# Patient Record
Sex: Female | Born: 1950 | Race: White | Hispanic: No | Smoking: Former smoker
Health system: Southern US, Community
[De-identification: ages and names within clinical notes are randomized; demographics above are authoritative.]

## PROBLEM LIST (undated history)

## (undated) DIAGNOSIS — F329 Major depressive disorder, single episode, unspecified: Secondary | ICD-10-CM

## (undated) DIAGNOSIS — K219 Gastro-esophageal reflux disease without esophagitis: Secondary | ICD-10-CM

## (undated) DIAGNOSIS — J449 Chronic obstructive pulmonary disease, unspecified: Secondary | ICD-10-CM

## (undated) DIAGNOSIS — K589 Irritable bowel syndrome without diarrhea: Secondary | ICD-10-CM

## (undated) DIAGNOSIS — M359 Systemic involvement of connective tissue, unspecified: Secondary | ICD-10-CM

## (undated) DIAGNOSIS — Z8619 Personal history of other infectious and parasitic diseases: Secondary | ICD-10-CM

## (undated) DIAGNOSIS — K5790 Diverticulosis of intestine, part unspecified, without perforation or abscess without bleeding: Secondary | ICD-10-CM

## (undated) DIAGNOSIS — I639 Cerebral infarction, unspecified: Secondary | ICD-10-CM

## (undated) DIAGNOSIS — F419 Anxiety disorder, unspecified: Secondary | ICD-10-CM

## (undated) DIAGNOSIS — N809 Endometriosis, unspecified: Secondary | ICD-10-CM

## (undated) DIAGNOSIS — K449 Diaphragmatic hernia without obstruction or gangrene: Secondary | ICD-10-CM

## (undated) DIAGNOSIS — F32A Depression, unspecified: Secondary | ICD-10-CM

## (undated) HISTORY — PX: ABDOMINAL HYSTERECTOMY: SHX81

## (undated) HISTORY — DX: Depression, unspecified: F32.A

## (undated) HISTORY — PX: KNEE ARTHROSCOPY: SUR90

## (undated) HISTORY — PX: CHOLECYSTECTOMY: SHX55

## (undated) HISTORY — DX: Personal history of other infectious and parasitic diseases: Z86.19

## (undated) HISTORY — DX: Anxiety disorder, unspecified: F41.9

## (undated) HISTORY — PX: LAPAROTOMY: SHX154

## (undated) HISTORY — PX: APPENDECTOMY: SHX54

## (undated) HISTORY — PX: BREAST ENHANCEMENT SURGERY: SHX7

## (undated) HISTORY — DX: Cerebral infarction, unspecified: I63.9

## (undated) HISTORY — DX: Hemochromatosis, unspecified: E83.119

## (undated) HISTORY — PX: BILATERAL OOPHORECTOMY: SHX1221

---
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
## 2012-07-07 ENCOUNTER — Emergency Department (HOSPITAL_COMMUNITY)
Admission: EM | Admit: 2012-07-07 | Discharge: 2012-07-07 | Disposition: A | Discharge status: Home / Self Care | Payer: Medicare Other | Attending: Emergency Medicine | Admitting: Emergency Medicine

## 2012-07-07 ENCOUNTER — Emergency Department (HOSPITAL_COMMUNITY): Payer: Medicare Other

## 2012-07-07 ENCOUNTER — Encounter (HOSPITAL_COMMUNITY): Payer: Self-pay | Admitting: Nurse Practitioner

## 2012-07-07 DIAGNOSIS — R42 Dizziness and giddiness: Secondary | ICD-10-CM

## 2012-07-07 DIAGNOSIS — N390 Urinary tract infection, site not specified: Secondary | ICD-10-CM

## 2012-07-07 HISTORY — DX: Systemic involvement of connective tissue, unspecified: M35.9

## 2012-07-07 HISTORY — DX: Diverticulosis of intestine, part unspecified, without perforation or abscess without bleeding: K57.90

## 2012-07-07 HISTORY — DX: Chronic obstructive pulmonary disease, unspecified: J44.9

## 2012-07-07 HISTORY — DX: Endometriosis, unspecified: N80.9

## 2012-07-07 HISTORY — DX: Irritable bowel syndrome, unspecified: K58.9

## 2012-07-07 HISTORY — DX: Gastro-esophageal reflux disease without esophagitis: K21.9

## 2012-07-07 HISTORY — DX: Diaphragmatic hernia without obstruction or gangrene: K44.9

## 2012-07-07 LAB — CBC WITH DIFFERENTIAL/PLATELET
Basophils Absolute: 0 10*3/uL (ref 0.0–0.1)
Basophils Relative: 1 % (ref 0–1)
HCT: 44.7 % (ref 36.0–46.0)
MCHC: 35.6 g/dL (ref 30.0–36.0)
Monocytes Absolute: 0.4 10*3/uL (ref 0.1–1.0)
Neutro Abs: 3.4 10*3/uL (ref 1.7–7.7)
Platelets: 234 10*3/uL (ref 150–400)
RDW: 12.2 % (ref 11.5–15.5)

## 2012-07-07 LAB — URINALYSIS, ROUTINE W REFLEX MICROSCOPIC
Bilirubin Urine: NEGATIVE
Ketones, ur: NEGATIVE mg/dL
Nitrite: NEGATIVE
Specific Gravity, Urine: 1.012 (ref 1.005–1.030)
Urobilinogen, UA: 0.2 mg/dL (ref 0.0–1.0)

## 2012-07-07 LAB — BASIC METABOLIC PANEL
Calcium: 9.6 mg/dL (ref 8.4–10.5)
Chloride: 106 mEq/L (ref 96–112)
Creatinine, Ser: 0.61 mg/dL (ref 0.50–1.10)
GFR calc Af Amer: >90 mL/min (ref >90)
Potassium: 3.7 mEq/L (ref 3.5–5.1)

## 2012-07-07 LAB — TROPONIN I: Troponin I: <0.3 ng/mL (ref <0.30)

## 2012-07-07 MED ORDER — MECLIZINE HCL 50 MG PO TABS: 50.0000 mg | ORAL_TABLET | Freq: Two times a day (BID) | ORAL | Status: DC | PRN

## 2012-07-07 MED ORDER — CEPHALEXIN 500 MG PO CAPS: 500.0000 mg | ORAL_CAPSULE | Freq: Four times a day (QID) | ORAL | Status: DC

## 2012-07-07 MED ORDER — MECLIZINE HCL 25 MG PO TABS
25.0000 mg | ORAL_TABLET | Freq: Once | ORAL | Status: AC
  Administered 2012-07-07: 25 mg via ORAL
  Filled 2012-07-07: qty 1

## 2012-07-07 MED ORDER — SODIUM CHLORIDE 0.9 % IV BOLUS (SEPSIS)
1000.0000 mL | INTRAVENOUS | Status: AC
  Administered 2012-07-07: 1000 mL via INTRAVENOUS

## 2012-07-07 NOTE — ED Provider Notes (Signed)
History     CSN: [id number]  Arrival date & time 07/07/12  1514   First MD Initiated Contact with Patient 07/07/12 1518      Chief Complaint  Patient presents with  . Dizziness    (Consider location/radiation/quality/duration/timing/severity/associated sxs/prior treatment) HPI Comments: This is a 62 year old female, past medical history remarkable for COPD, who presents emergency department with chief complaint of dizziness. Patient states that she was walking earlier today, when she became lightheaded and felt as though she would faint. She endorses an additional sense of heaviness in bilateral upper extremities. She also endorses some nausea and vomiting, but states that this is normal for her. She denies any syncopal episode or loss of consciousness. She is received 4 mg of Zofran while in route to the ED. She states that she has not tried anything else to alleviate her symptoms. She has not experienced anything like this in the past. Nothing makes her symptoms better or worse. She denies any pain at this time, denies headache, chest pain, shortness of breath, diarrhea, constipation, numbness and tingling of the extremities.  The history is provided by the patient. No language interpreter was used.    No past medical history on file.  No past surgical history on file.  No family history on file.  History  Substance Use Topics  . Smoking status: Not on file  . Smokeless tobacco: Not on file  . Alcohol Use: Not on file    OB History    No data available      Review of Systems  All other systems reviewed and are negative.    Allergies  Review of patient's allergies indicates not on file.  Home Medications  No current outpatient prescriptions on file.  There were no vitals taken for this visit.  Physical Exam  Nursing note and vitals reviewed. Constitutional: She is oriented to person, place, and time. She appears well-developed and well-nourished.  HENT:  Head:  Normocephalic and atraumatic.  Eyes: Conjunctivae normal and EOM are normal. Pupils are equal, round, and reactive to light.  Neck: Normal range of motion. Neck supple.  Cardiovascular: Normal rate and regular rhythm.  Exam reveals no gallop and no friction rub.   No murmur heard. Pulmonary/Chest: Effort normal and breath sounds normal. No respiratory distress. She has no wheezes. She has no rales. She exhibits no tenderness.  Abdominal: Soft. Bowel sounds are normal. She exhibits no distension and no mass. There is no tenderness. There is no rebound and no guarding.  Musculoskeletal: Normal range of motion. She exhibits no edema and no tenderness.  Neurological: She is alert and oriented to person, place, and time.       CN 3-12 intact, strength and sensation intact bilaterally.  Skin: Skin is warm and dry.  Psychiatric: She has a normal mood and affect. Her behavior is normal. Judgment and thought content normal.    ED Course  Procedures (including critical care time)   Labs Reviewed  CBC WITH DIFFERENTIAL  BASIC METABOLIC PANEL  TROPONIN I  URINALYSIS, ROUTINE W REFLEX MICROSCOPIC   No results found.  ED ECG REPORT  I personally interpreted this EKG   Date: 07/07/2012   Rate: 76  Rhythm: normal sinus rhythm  QRS Axis: normal  Intervals: normal  ST/T Wave abnormalities: normal  Conduction Disutrbances:none  Narrative Interpretation:   Old EKG Reviewed: none available  Results for orders placed during the hospital encounter of 07/07/12  CBC WITH DIFFERENTIAL  Component Value Range   WBC 5.7  4.0 - 10.5 K/uL   RBC 4.91  3.87 - 5.11 MIL/uL   Hemoglobin 15.9 (*) 12.0 - 15.0 g/dL   HCT 04.5  40.9 - 81.1 %   MCV 91.0  78.0 - 100.0 fL   MCH 32.4  26.0 - 34.0 pg   MCHC 35.6  30.0 - 36.0 g/dL   RDW 91.4  78.2 - 95.6 %   Platelets 234  150 - 400 K/uL   Neutrophils Relative 59  43 - 77 %   Neutro Abs 3.4  1.7 - 7.7 K/uL   Lymphocytes Relative 32  12 - 46 %   Lymphs Abs  1.8  0.7 - 4.0 K/uL   Monocytes Relative 6  3 - 12 %   Monocytes Absolute 0.4  0.1 - 1.0 K/uL   Eosinophils Relative 2  0 - 5 %   Eosinophils Absolute 0.1  0.0 - 0.7 K/uL   Basophils Relative 1  0 - 1 %   Basophils Absolute 0.0  0.0 - 0.1 K/uL  BASIC METABOLIC PANEL      Component Value Range   Sodium 141  135 - 145 mEq/L   Potassium 3.7  3.5 - 5.1 mEq/L   Chloride 106  96 - 112 mEq/L   CO2 22  19 - 32 mEq/L   Glucose, Bld 99  70 - 99 mg/dL   BUN 12  6 - 23 mg/dL   Creatinine, Ser 2.13  0.50 - 1.10 mg/dL   Calcium 9.6  8.4 - 08.6 mg/dL   GFR calc non Af Amer >90  >90 mL/min   GFR calc Af Amer >90  >90 mL/min  TROPONIN I      Component Value Range   Troponin I <0.30  <0.30 ng/mL  URINALYSIS, ROUTINE W REFLEX MICROSCOPIC      Component Value Range   Color, Urine YELLOW  YELLOW   APPearance CLEAR  CLEAR   Specific Gravity, Urine 1.012  1.005 - 1.030   pH 6.0  5.0 - 8.0   Glucose, UA NEGATIVE  NEGATIVE mg/dL   Hgb urine dipstick NEGATIVE  NEGATIVE   Bilirubin Urine NEGATIVE  NEGATIVE   Ketones, ur NEGATIVE  NEGATIVE mg/dL   Protein, ur NEGATIVE  NEGATIVE mg/dL   Urobilinogen, UA 0.2  0.0 - 1.0 mg/dL   Nitrite NEGATIVE  NEGATIVE   Leukocytes, UA MODERATE (*) NEGATIVE  URINE MICROSCOPIC-ADD ON      Component Value Range   Squamous Epithelial / LPF RARE  RARE   WBC, UA 3-6  <3 WBC/hpf   Bacteria, UA RARE  RARE  TROPONIN I      Component Value Range   Troponin I <0.30  <0.30 ng/mL   Ct Head Wo Contrast  07/07/2012  *RADIOLOGY REPORT*  Clinical Data: Nausea.  Dizziness.  CT HEAD WITHOUT CONTRAST  Technique:  Contiguous axial images were obtained from the base of the skull through the vertex without contrast.  Comparison: None.  Findings: No intracranial hemorrhage.  No CT evidence of large acute infarct.  No hydrocephalus.  No intracranial mass lesion detected on this unenhanced exam.  Visualized sinuses and mastoid air cells clear.  Orbital structures unremarkable.  IMPRESSION:  No evidence of intracranial hemorrhage or CT findings of large acute infarct.   Original Report Authenticated By: Lacy Duverney, M.D.    Dg Chest Port 1 View  07/07/2012  *RADIOLOGY REPORT*  Clinical Data: Dizziness.  PORTABLE CHEST -  1 VIEW  Comparison: None.  Findings: Mild hyperinflation of the lungs.  Heart is normal size. No effusions.  No acute bony abnormality.  No acute airspace opacity.  IMPRESSION: Hyperinflation compatible with COPD.  No acute findings.   Original Report Authenticated By: Charlett Nose, M.D.       1. Dizziness   2. UTI (lower urinary tract infection)       MDM  This is a 62 year old female who presents with dizziness. Patient is relatively low risk for cardiac etiology, she does not have diabetes, hypertension, hyperlipidemia, and denies any chest pain with the dizziness. She does complain of some heaviness in her arms, but does not have any weakness or loss of sensation. However, will obtain basic labs, EKG, and chest x-ray.     6:27 PM The patient's basic labs and imaging studies are unremarkable for any acute process. Patient has been seen by and discussed with Dr. Jeraldine Loots. Patient still not feeling any better. Will order CT scan of the head, and she states that her head feels "funny."  CT head shows no acute process. Patient still states that she feels dizzy. I'm going to try giving her some meclizine and see if she improves.  10:00 PM Patient feeling somewhat improved after having meclizine. Orthostatic vital signs are stable. Patient was able to ambulate. Repeat troponin is negative. Doubt cardiac etiology. I'm going to discharge the patient to home with primary care followup. Will give the patient meclizine to go home with. Patient understands and agrees with the plan. She is stable and ready for discharge.        Roxy Horseman, PA-C 07/07/12 2201  Roxy Horseman, PA-C 07/07/12 2201  Roxy Horseman, PA-C 07/07/12 2206

## 2012-07-07 NOTE — ED Provider Notes (Signed)
Patient presents after an episode of dizziness.  The patient's emergency department stay, she remained hemodynamically stable.  The patient's evaluation was largely reassuring, without evidence of acute changes.  The patient requested CT scan of her head.  This was reassuring.  With an improvement subjectively, and absent acute ongoing complaints, the patient was appropriate for discharge with close outpatient followup.  Absent ongoing chest pain, syncope, nausea, vomiting, or concerning EKG changes, and without positive cardiac markers, the patient was discharged in stable condition  Gerhard Munch, MD 07/07/12 2358

## 2012-07-07 NOTE — ED Notes (Signed)
Patient ambulated in hallway. Became short of breath. O2 sat remained at 99% throughout ambulation.

## 2012-07-07 NOTE — ED Notes (Signed)
Pt has had 4mg  IV  of zofran, pt has 20G left hand

## 2012-07-07 NOTE — ED Notes (Signed)
Per EMS pt sts feeling lightheaded around 2:30 when walking down the fall and felt like she may faint. Pt. Did not have syncopal episode. Pt c/o nausea no pain, headache or dizziness, stroke scale negative- EKG NSR BP 170/92 HR 92 99%/RA CBG 133

## 2012-07-07 NOTE — ED Notes (Signed)
Dr.Lockwood at bedside  

## 2012-07-07 NOTE — ED Notes (Signed)
Unable to ambulate pt. sts feeling dizzy- that head is spinning

## 2014-03-19 HISTORY — PX: COLONOSCOPY: SHX174

## 2014-03-29 HISTORY — PX: ESOPHAGOGASTRODUODENOSCOPY: SHX1529

## 2018-10-24 ENCOUNTER — Telehealth: Payer: Self-pay | Admitting: Cardiology

## 2018-10-24 NOTE — Telephone Encounter (Signed)
Virtual Visit Pre-Appointment Phone Call  "(Name), I am calling you today to discuss your upcoming appointment. We are currently trying to limit exposure to the virus that causes COVID-19 by seeing patients at home rather than in the office."  1. "What is the BEST phone number to call the day of the visit?" - include this in appointment notes  2. Do you have or have access to (through a family member/friend) a smartphone with video capability that we can use for your visit?" a. If yes - list this number in appt notes as cell (if different from BEST phone #) and list the appointment type as a VIDEO visit in appointment notes b. If no - list the appointment type as a PHONE visit in appointment notes  3. Confirm consent - "In the setting of the current Covid19 crisis, you are scheduled for a (phone or video) visit with your provider on (date) at (time).  Just as we do with many in-office visits, in order for you to participate in this visit, we must obtain consent.  If you'd like, I can send this to your mychart (if signed up) or email for you to review.  Otherwise, I can obtain your verbal consent now.  All virtual visits are billed to your insurance company just like a normal visit would be.  By agreeing to a virtual visit, we'd like you to understand that the technology does not allow for your provider to perform an examination, and thus may limit your provider's ability to fully assess your condition. If your provider identifies any concerns that need to be evaluated in person, we will make arrangements to do so.  Finally, though the technology is pretty good, we cannot assure that it will always work on either your or our end, and in the setting of a video visit, we may have to convert it to a phone-only visit.  In either situation, we cannot ensure that we have a secure connection.  Are you willing to proceed?" STAFF: Did the patient verbally acknowledge consent to telehealth visit? Document  YES/NO here: YES  4. Advise patient to be prepared - "Two hours prior to your appointment, go ahead and check your blood pressure, pulse, oxygen saturation, and your weight (if you have the equipment to check those) and write them all down. When your visit starts, your provider will ask you for this information. If you have an Apple Watch or Kardia device, please plan to have heart rate information ready on the day of your appointment. Please have a pen and paper handy nearby the day of the visit as well."  5. Give patient instructions for MyChart download to smartphone OR Doximity/Doxy.me as below if video visit (depending on what platform provider is using)  6. Inform patient they will receive a phone call 15 minutes prior to their appointment time (may be from unknown caller ID) so they should be prepared to answer    [facility] has been deemed a candidate for a follow-up tele-health visit to limit community exposure during the Covid-19 pandemic. I spoke with the patient via phone to ensure availability of phone/video source, confirm preferred email & phone number, and discuss instructions and expectations.  I reminded Wendy Gomez to be prepared with any vital sign and/or heart rhythm information that could potentially be obtained via home monitoring, at the time of her visit. I reminded Wendy Gomez to expect a phone call prior to her visit.  Isaiah Blakes 10/24/2018 1:13 PM   INSTRUCTIONS FOR DOWNLOADING THE MYCHART APP TO SMARTPHONE  - The patient must first make sure to have activated MyChart and know their login information - If Apple, go to [employer] and type in MyChart in the search bar and download the app. If Android, ask patient to go to [employer] and type in [place] in the search bar and download the app. The app is free but as with any other app downloads, their phone may require them to verify saved payment information or Apple/Android  password.  - The patient will need to then log into the app with their MyChart username and password, and select [facility] as their healthcare provider to link the account. When it is time for your visit, go to the MyChart app, find appointments, and click Begin Video Visit. Be sure to Select Allow for your device to access the Microphone and Camera for your visit. You will then be connected, and your provider will be with you shortly.  **If they have any issues connecting, or need assistance please contact MyChart service desk [phone])**  **If using a computer, in order to ensure the best quality for their visit they will need to use either of the following Internet Browsers: [employer], or Google Chrome**  IF USING DOXIMITY or DOXY.ME - The patient will receive a link just prior to their visit by text.     FULL LENGTH CONSENT FOR TELE-HEALTH VISIT   I hereby voluntarily request, consent and authorize [facility] and its employed or contracted physicians, physician assistants, nurse practitioners or other licensed health care professionals (the Practitioner), to provide me with telemedicine health care services (the Services") as deemed necessary by the treating Practitioner. I acknowledge and consent to receive the Services by the Practitioner via telemedicine. I understand that the telemedicine visit will involve communicating with the Practitioner through live audiovisual communication technology and the disclosure of certain medical information by electronic transmission. I acknowledge that I have been given the opportunity to request an in-person assessment or other available alternative prior to the telemedicine visit and am voluntarily participating in the telemedicine visit.  I understand that I have the right to withhold or withdraw my consent to the use of telemedicine in the course of my care at any time, without affecting my right to future care or treatment,  and that the Practitioner or I may terminate the telemedicine visit at any time. I understand that I have the right to inspect all information obtained and/or recorded in the course of the telemedicine visit and may receive copies of available information for a reasonable fee.  I understand that some of the potential risks of receiving the Services via telemedicine include:   Delay or interruption in medical evaluation due to technological equipment failure or disruption;  Information transmitted may not be sufficient (e.g. poor resolution of images) to allow for appropriate medical decision making by the Practitioner; and/or   In rare instances, security protocols could fail, causing a breach of personal health information.  Furthermore, I acknowledge that it is my responsibility to provide information about my medical history, conditions and care that is complete and accurate to the best of my ability. I acknowledge that Practitioner's advice, recommendations, and/or decision may be based on factors not within their control, such as incomplete or inaccurate data provided by me or distortions of diagnostic images or specimens that may result from electronic transmissions. I understand that the  practice of medicine is not an Chief Strategy Officer and that Practitioner makes no warranties or guarantees regarding treatment outcomes. I acknowledge that I will receive a copy of this consent concurrently upon execution via email to the email address I last provided but may also request a printed copy by calling the office of [facility].    I understand that my insurance will be billed for this visit.   I have read or had this consent read to me.  I understand the contents of this consent, which adequately explains the benefits and risks of the Services being provided via telemedicine.   I have been provided ample opportunity to ask questions regarding this consent and the Services and have had my questions  answered to my satisfaction.  I give my informed consent for the services to be provided through the use of telemedicine in my medical care  By participating in this telemedicine visit I agree to the above.

## 2018-10-31 DIAGNOSIS — F411 Generalized anxiety disorder: Secondary | ICD-10-CM | POA: Insufficient documentation

## 2018-10-31 DIAGNOSIS — I10 Essential (primary) hypertension: Secondary | ICD-10-CM | POA: Insufficient documentation

## 2018-10-31 DIAGNOSIS — R0789 Other chest pain: Secondary | ICD-10-CM | POA: Insufficient documentation

## 2018-10-31 DIAGNOSIS — R002 Palpitations: Secondary | ICD-10-CM | POA: Insufficient documentation

## 2018-11-01 ENCOUNTER — Other Ambulatory Visit: Payer: Self-pay

## 2018-11-01 ENCOUNTER — Encounter: Payer: Self-pay | Admitting: Cardiology

## 2018-11-01 ENCOUNTER — Telehealth (INDEPENDENT_AMBULATORY_CARE_PROVIDER_SITE_OTHER): Payer: Medicare HMO | Admitting: Cardiology

## 2018-11-01 VITALS — Ht 67.0 in | Wt 221.0 lb

## 2018-11-01 DIAGNOSIS — R002 Palpitations: Secondary | ICD-10-CM

## 2018-11-01 DIAGNOSIS — R0789 Other chest pain: Secondary | ICD-10-CM

## 2018-11-01 DIAGNOSIS — I10 Essential (primary) hypertension: Secondary | ICD-10-CM

## 2018-11-01 DIAGNOSIS — G4733 Obstructive sleep apnea (adult) (pediatric): Secondary | ICD-10-CM | POA: Insufficient documentation

## 2018-11-01 NOTE — Patient Instructions (Signed)
Medication Instructions:  Your physician recommends that you continue on your current medications as directed. Please refer to the Current Medication list given to you today.  If you need a refill on your cardiac medications before your next appointment, please call your pharmacy.   Lab work: None.  If you have labs (blood work) drawn today and your tests are completely normal, you will receive your results only by: Marland Kitchen MyChart Message (if you have MyChart) OR . A paper copy in the mail If you have any lab test that is abnormal or we need to change your treatment, we will call you to review the results.  Testing/Procedures: Your physician has requested that you have an echocardiogram. Echocardiography is a painless test that uses sound waves to create images of your heart. It provides your doctor with information about the size and shape of your heart and how well your heart's chambers and valves are working. This procedure takes approximately one hour. There are no restrictions for this procedure.  Your physician has recommended that you wear a holter monitor. Holter monitors are medical devices that record the heart's electrical activity. Doctors most often use these monitors to diagnose arrhythmias. Arrhythmias are problems with the speed or rhythm of the heartbeat. The monitor is a small, portable device. You can wear one while you do your normal daily activities. This is usually used to diagnose what is causing palpitations/syncope (passing out). Wear for 14 days.     Follow-Up: At [facility], you and your health needs are our priority.  As part of our continuing mission to provide you with exceptional heart care, we have created designated Provider Care Teams.  These Care Teams include your primary Cardiologist (physician) and Advanced Practice Providers (APPs -  Physician Assistants and Nurse Practitioners) who all work together to provide you with the care you need, when you need it.  You will need a follow up appointment in 1 months.  Please call our office 2 months in advance to schedule this appointment.  You may see No primary care provider on file. or another member of our [employer] Provider Team in [place]: Shirlee More, MD . Jyl Heinz, MD  Any Other Special Instructions Will Be Listed Below (If Applicable).   Echocardiogram An echocardiogram is a procedure that uses painless sound waves (ultrasound) to produce an image of the heart. Images from an echocardiogram can provide important information about:  Signs of coronary artery disease (CAD).  Aneurysm detection. An aneurysm is a weak or damaged part of an artery wall that bulges out from the normal force of blood pumping through the body.  Heart size and shape. Changes in the size or shape of the heart can be associated with certain conditions, including heart failure, aneurysm, and CAD.  Heart muscle function.  Heart valve function.  Signs of a past heart attack.  Fluid buildup around the heart.  Thickening of the heart muscle.  A tumor or infectious growth around the heart valves. Tell a health care provider about:  Any allergies you have.  All medicines you are taking, including vitamins, herbs, eye drops, creams, and over-the-counter medicines.  Any blood disorders you have.  Any surgeries you have had.  Any medical conditions you have.  Whether you are pregnant or may be pregnant. What are the risks? Generally, this is a safe procedure. However, problems may occur, including:  Allergic reaction to dye (contrast) that may be used during the procedure. What happens before the procedure?  No specific preparation is needed. You may eat and drink normally. What happens during the procedure?   An IV tube may be inserted into one of your veins.  You may receive contrast through this tube. A contrast is an injection that improves the quality of the pictures from your heart.  A  gel will be applied to your chest.  A wand-like tool (transducer) will be moved over your chest. The gel will help to transmit the sound waves from the transducer.  The sound waves will harmlessly bounce off of your heart to allow the heart images to be captured in real-time motion. The images will be recorded on a computer. The procedure may vary among health care providers and hospitals. What happens after the procedure?  You may return to your normal, everyday life, including diet, activities, and medicines, unless your health care provider tells you not to do that. Summary  An echocardiogram is a procedure that uses painless sound waves (ultrasound) to produce an image of the heart.  Images from an echocardiogram can provide important information about the size and shape of your heart, heart muscle function, heart valve function, and fluid buildup around your heart.  You do not need to do anything to prepare before this procedure. You may eat and drink normally.  After the echocardiogram is completed, you may return to your normal, everyday life, unless your health care provider tells you not to do that. This information is not intended to replace advice given to you by your health care provider. Make sure you discuss any questions you have with your health care provider. Document Released: 05/28/2000 Document Revised: 07/03/2016 Document Reviewed: 07/03/2016 Elsevier Interactive Patient Education  2019 [employer].

## 2018-11-01 NOTE — Progress Notes (Signed)
Virtual Visit via Telephone Note   This visit type was conducted due to national recommendations for restrictions regarding the COVID-19 Pandemic (e.g. social distancing) in an effort to limit this patient's exposure and mitigate transmission in our community.  Due to her co-morbid illnesses, this patient is at least at moderate risk for complications without adequate follow up.  This format is felt to be most appropriate for this patient at this time.  The patient did not have access to video technology/had technical difficulties with video requiring transitioning to audio format only (telephone).  All issues noted in this document were discussed and addressed.  No physical exam could be performed with this format.  Please refer to the patient's chart for her  consent to telehealth for [facility].  Evaluation Performed:  Follow-up visit  This visit type was conducted due to national recommendations for restrictions regarding the COVID-19 Pandemic (e.g. social distancing).  This format is felt to be most appropriate for this patient at this time.  All issues noted in this document were discussed and addressed.  No physical exam was performed (except for noted visual exam findings with Video Visits).  Please refer to the patient's chart (MyChart message for video visits and phone note for telephone visits) for the patient's consent to telehealth for [facility].  Date:  11/01/2018  ID: Wendy Gomez, DOB 06/24/1950, MRN [id number]   Patient Location: [address] [place] [address]   Provider location:   [place] Office  PCP:  Gala Lewandowsky, MD  Cardiologist:  Jenne Campus, MD     Chief Complaint: I have palpitations  History of Present Illness:    Wendy Gomez is a 68 y.o. female  who presents via audio/video conferencing for a telehealth visit today.  He has history of palpitations for a few years there is typically happen about once a month  however a few weeks ago she got very unusual episode of palpitations she felt her heart speeding up a lot and beating somewhat irregular.  It lasted more than 1 hour which was unusual duration typically just few minutes and her heart goes back to normal she actually went to her family member who is no expectoration when she check her blood pressure which was elevated and heart rate was between 07/14/1947 and she was told she got atrial fibrillation she was told to go to the emergency room but never date eventually about 1-1/2-hour later the patient subsided since that time she has been doing well.  She did not have any chest pain when she got palpitations just awareness of her heart beating fast.  There is mild shortness of breath associated with this.  She actually drove to her family member in the car and then walk to the house and when she walks she felt a little bit dizzy but she did not passed out.  Since the time of the palpitation there was no more palpitations.  Prior episodes were shorter and not as profound. She does have history of sleep apnea however she does not use any CPAP mask. She does have history of hypertension but no diabetes no congestive heart failure no TIA no stroke.  Her chads 2 Vascor equals 3 Does have family history of premature coronary artery disease in some of her family members actually my patients.   The patient does not have symptoms concerning for COVID-19 infection (fever, chills, cough, or new SHORTNESS OF BREATH).    Prior CV studies:  The following studies were reviewed today:  EKG done by primary care physician showed normal sinus rhythm at rate of 72 normal P interval normal QS complex duration morphology no ST-T segment changes.  Thyroid was checked which was normal her LDL was checked as well which was 126 which is elevated.  Troponin I was negative.     Past Medical History:  Diagnosis Date  . Connective tissue disease ([place])   . COPD (chronic  obstructive pulmonary disease) ([place])   . Diverticulosis    hx of   . Endometriosis   . GERD (gastroesophageal reflux disease)   . Irritable bowel syndrome   . Thoracic stomach hernia     Past Surgical History:  Procedure Laterality Date  . ABDOMINAL HYSTERECTOMY    . APPENDECTOMY    . BILATERAL OOPHORECTOMY    . BREAST ENHANCEMENT SURGERY    . CHOLECYSTECTOMY    . CHOLECYSTECTOMY    . LAPAROTOMY       Current Meds  Medication Sig  . albuterol (PROVENTIL HFA;VENTOLIN HFA) 108 (90 BASE) MCG/ACT inhaler Inhale 2 puffs into the lungs every 6 (six) hours as needed.  Marland Kitchen aspirin EC 81 MG tablet Take 81 mg by mouth daily.  Marland Kitchen Chock ELDERBERRY PO Take 1,000 mg by mouth.  . cholecalciferol (VITAMIN D3) 25 MCG (1000 UT) tablet Take 2,000 Units by mouth daily.  . fluticasone (FLONASE) 50 MCG/ACT nasal spray Place 2 sprays into both nostrils as needed.  . metoprolol tartrate (LOPRESSOR) 25 MG tablet Take 12.5 mg by mouth 2 (two) times daily.  . Omega-3 Fatty Acids (FISH OIL) 1000 MG CAPS Take 2 capsules by mouth daily.  Marland Kitchen omeprazole (PRILOSEC) 20 MG capsule Take 20 mg by mouth daily.  . Polyethylene Glycol 3350 (MIRALAX PO) Take by mouth daily.      Family History: The patient's family history includes Diabetes in her sister; Hemochromatosis in her sister; Hyperlipidemia in her sister; Hypertension in her sister.   ROS:   Please see the history of present illness.     All other systems reviewed and are negative.   Labs/Other Tests and Data Reviewed:     Recent Labs: No results found for requested labs within last 8760 hours.  Recent Lipid Panel No results found for: CHOL, TRIG, HDL, CHOLHDL, VLDL, LDLCALC, LDLDIRECT    Exam:    Vital Signs:  Ht 5\' 7"  (1.702 m)   Wt 221 lb (100.2 kg)   BMI 34.61 kg/m     Wt Readings from Last 3 Encounters:  11/01/18 221 lb (100.2 kg)     Well nourished, well developed in no acute distress. Alert awake and at x3.  She does not have  any technical ability to do video visit therefore with talking only over the phone.  Overall asymptomatic stable and comfortable during the conversation.  Quite cheerful.  Diagnosis for this visit:   1. Palpitations   2. Atypical chest pain   3. Essential hypertension   4. Obstructive sleep apnea      ASSESSMENT & PLAN:    1.  Palpitations possibly atrial fibrillation but we do not have hard copy of it before potential initiation of anticoagulation we have approve that she truly have atrial fibrillation.  Therefore, I will ask you to work 2 weeks 0 patch to see if we can catch an episode of atrial fibrillation.  I will also schedule her to have echocardiogram to assess left atrial size.  In the meantime she is taking  aspirin as well as small dose of beta-blocker metoprolol only 12.5 once a day which I will continue. 2.  Atypical chest pain involving the left shoulder not related to exercise.  Not worse with motion coughing and she graded this sensation 3-4 in scale up to 10.  That sensation lasted for few minutes.  Sometimes it does happen with exercise. 3.  Essential hypertension recent initiation of beta-blocker to help with that she reports blood pressure systolic between 583 mmHg and 140 mmHg. 4.  Obstructive sleep apnea she does not want to wear a mask I told her this is clinically essential for somebody with atrial fibrillation but again we need to first establish diagnosis she will monitor if that will be unrevealing she will may require to purchase some device that will allow her to record EKG when she gets some palpitations. 5.  Dyslipidemia not to the point that we need to initiate therapy at the moment.  We talked about healthy lifestyle and exercises on a regular basis and good diet. 6.  COPD she quit smoking 2008.  And she abstain from smoking.  COVID-19 Education: The signs and symptoms of COVID-19 were discussed with the patient and how to seek care for testing (follow up with PCP  or arrange E-visit).  The importance of social distancing was discussed today.  Patient Risk:   After full review of this patients clinical status, I feel that they are at least moderate risk at this time.  Time:   Today, I have spent 31 minutes with the patient with telehealth technology discussing pt health issues.  I spent 60minutes reviewing her chart before the visit.  Visit was finished at 4:33PM.    Medication Adjustments/Labs and Tests Ordered: Current medicines are reviewed at length with the patient today.  Concerns regarding medicines are outlined above.  No orders of the defined types were placed in this encounter.  Medication changes: No orders of the defined types were placed in this encounter.    Disposition:  1 month f/up  Signed, Park Liter, MD, [facility] 11/01/2018 4:35 PM    [place]

## 2018-11-07 ENCOUNTER — Other Ambulatory Visit (INDEPENDENT_AMBULATORY_CARE_PROVIDER_SITE_OTHER): Payer: Medicare HMO

## 2018-11-07 DIAGNOSIS — R0789 Other chest pain: Secondary | ICD-10-CM | POA: Diagnosis not present

## 2018-11-07 DIAGNOSIS — R002 Palpitations: Secondary | ICD-10-CM | POA: Diagnosis not present

## 2018-11-17 ENCOUNTER — Other Ambulatory Visit: Payer: Medicare HMO

## 2018-11-23 ENCOUNTER — Ambulatory Visit (INDEPENDENT_AMBULATORY_CARE_PROVIDER_SITE_OTHER): Payer: Medicare HMO

## 2018-11-23 ENCOUNTER — Other Ambulatory Visit: Payer: Self-pay

## 2018-11-23 DIAGNOSIS — R002 Palpitations: Secondary | ICD-10-CM

## 2018-11-23 DIAGNOSIS — R0789 Other chest pain: Secondary | ICD-10-CM

## 2018-11-23 NOTE — Progress Notes (Signed)
2D Echocardiogram performed     11/23/18 Cardell Peach RDCS, RVT

## 2018-11-30 ENCOUNTER — Other Ambulatory Visit: Payer: Self-pay

## 2018-11-30 ENCOUNTER — Encounter: Payer: Self-pay | Admitting: Cardiology

## 2018-11-30 ENCOUNTER — Telehealth (INDEPENDENT_AMBULATORY_CARE_PROVIDER_SITE_OTHER): Payer: Medicare HMO | Admitting: Cardiology

## 2018-11-30 DIAGNOSIS — R002 Palpitations: Secondary | ICD-10-CM | POA: Diagnosis not present

## 2018-11-30 DIAGNOSIS — I10 Essential (primary) hypertension: Secondary | ICD-10-CM

## 2018-11-30 DIAGNOSIS — R0789 Other chest pain: Secondary | ICD-10-CM

## 2018-11-30 NOTE — Progress Notes (Signed)
Virtual Visit via Telephone Note   This visit type was conducted due to national recommendations for restrictions regarding the COVID-19 Pandemic (e.g. social distancing) in an effort to limit this patient's exposure and mitigate transmission in our community.  Due to her co-morbid illnesses, this patient is at least at moderate risk for complications without adequate follow up.  This format is felt to be most appropriate for this patient at this time.  The patient did not have access to video technology/had technical difficulties with video requiring transitioning to audio format only (telephone).  All issues noted in this document were discussed and addressed.  No physical exam could be performed with this format.  Please refer to the patient's chart for her  consent to telehealth for [facility].  Evaluation Performed:  Follow-up visit  This visit type was conducted due to national recommendations for restrictions regarding the COVID-19 Pandemic (e.g. social distancing).  This format is felt to be most appropriate for this patient at this time.  All issues noted in this document were discussed and addressed.  No physical exam was performed (except for noted visual exam findings with Video Visits).  Please refer to the patient's chart (MyChart message for video visits and phone note for telephone visits) for the patient's consent to telehealth for [facility].  Date:  11/30/2018  ID: Wendy Gomez, DOB [birth year], MRN [id number]   Patient Location: [address] [place] [address]   Provider location:   [place] Office  PCP:  Gala Lewandowsky, MD  Cardiologist:  Jenne Campus, MD     Chief Complaint: Doing better but still has some palpitations  History of Present Illness:    Wendy Gomez is a 68 y.o. female  who presents via audio/video conferencing for a telehealth visit today.  With palpitations, hypertension, COPD.  I spoke to her a few weeks ago  because of palpitations I put her on small dose of beta-blocker monitor has been placed.  I still do not have a monitor back.  Echocardiogram has been done which showed normal left ventricular ejection fraction without any issues.  She is feeling better but still gets some palpitations.  No dizziness no passing out   The patient does not have symptoms concerning for COVID-19 infection (fever, chills, cough, or new SHORTNESS OF BREATH).    Prior CV studies:   The following studies were reviewed today:  Echocardiogram showing normal ejection fraction     Past Medical History:  Diagnosis Date  . Connective tissue disease ([place])   . COPD (chronic obstructive pulmonary disease) ([place])   . Diverticulosis    hx of   . Endometriosis   . GERD (gastroesophageal reflux disease)   . Irritable bowel syndrome   . Thoracic stomach hernia     Past Surgical History:  Procedure Laterality Date  . ABDOMINAL HYSTERECTOMY    . APPENDECTOMY    . BILATERAL OOPHORECTOMY    . BREAST ENHANCEMENT SURGERY    . CHOLECYSTECTOMY    . CHOLECYSTECTOMY    . LAPAROTOMY       Current Meds  Medication Sig  . albuterol (PROVENTIL HFA;VENTOLIN HFA) 108 (90 BASE) MCG/ACT inhaler Inhale 2 puffs into the lungs every 6 (six) hours as needed.  Marland Kitchen aspirin EC 81 MG tablet Take 81 mg by mouth daily.  Marland Kitchen Kamrowski ELDERBERRY PO Take 1,000 mg by mouth.  . cholecalciferol (VITAMIN D3) 25 MCG (1000 UT) tablet Take 2,000 Units by mouth daily.  Marland Kitchen  fluticasone (FLONASE) 50 MCG/ACT nasal spray Place 2 sprays into both nostrils as needed.  . metoprolol tartrate (LOPRESSOR) 25 MG tablet Take 12.5 mg by mouth 2 (two) times daily.  . Omega-3 Fatty Acids (FISH OIL) 1000 MG CAPS Take 2 capsules by mouth daily.  Marland Kitchen omeprazole (PRILOSEC) 20 MG capsule Take 20 mg by mouth daily.  . Polyethylene Glycol 3350 (MIRALAX PO) Take by mouth daily.      Family History: The patient's family history includes Diabetes in her sister; Hemochromatosis  in her sister; Hyperlipidemia in her sister; Hypertension in her sister.   ROS:   Please see the history of present illness.     All other systems reviewed and are negative.   Labs/Other Tests and Data Reviewed:     Recent Labs: No results found for requested labs within last 8760 hours.  Recent Lipid Panel No results found for: CHOL, TRIG, HDL, CHOLHDL, VLDL, LDLCALC, LDLDIRECT    Exam:    Vital Signs:  There were no vitals taken for this visit.    Wt Readings from Last 3 Encounters:  11/01/18 221 lb (100.2 kg)     Well nourished, well developed in no acute distress. Alert oriented x3 talking to me over the phone unable to establish video link.  Doing well  Diagnosis for this visit:   1. Palpitations   2. Atypical chest pain   3. Essential hypertension      ASSESSMENT & PLAN:    1.  Palpitations still present, therefore I will increase dose of metoprolol to 25 twice daily, will wait for monitor to see exactly what kind of arrhythmia with dealing with.  Encouraging is the fact that her echocardiogram showed preserved ejection fraction 2.  Atypical chest pain denies having any 3.  Essential hypertension does not have ability to check her blood pressure.  COVID-19 Education: The signs and symptoms of COVID-19 were discussed with the patient and how to seek care for testing (follow up with PCP or arrange E-visit).  The importance of social distancing was discussed today.  Patient Risk:   After full review of this patients clinical status, I feel that they are at least moderate risk at this time.  Time:   Today, I have spent 15 minutes with the patient with telehealth technology discussing pt health issues.  I spent 5 minutes reviewing her chart before the visit.  Visit was finished at 4:48 PM.    Medication Adjustments/Labs and Tests Ordered: Current medicines are reviewed at length with the patient today.  Concerns regarding medicines are outlined above.  No  orders of the defined types were placed in this encounter.  Medication changes: No orders of the defined types were placed in this encounter.    Disposition: Follow-up in 1 month  Signed, Park Liter, MD, [facility] 11/30/2018 4:49 PM    [place]

## 2018-11-30 NOTE — Patient Instructions (Signed)
Medication Instructions:  .isntcur  If you need a refill on your cardiac medications before your next appointment, please call your pharmacy.   Lab work: None.  If you have labs (blood work) drawn today and your tests are completely normal, you will receive your results only by: Marland Kitchen MyChart Message (if you have MyChart) OR . A paper copy in the mail If you have any lab test that is abnormal or we need to change your treatment, we will call you to review the results.  Testing/Procedures: None.   Follow-Up: At [facility], you and your health needs are our priority.  As part of our continuing mission to provide you with exceptional heart care, we have created designated Provider Care Teams.  These Care Teams include your primary Cardiologist (physician) and Advanced Practice Providers (APPs -  Physician Assistants and Nurse Practitioners) who all work together to provide you with the care you need, when you need it. You will need a follow up appointment in 1 months.  Please call our office 2 months in advance to schedule this appointment.  You may see No primary care provider on file. or another member of our [employer] Provider Team in [place]: Shirlee More, MD . Jyl Heinz, MD  Any Other Special Instructions Will Be Listed Below (If Applicable).

## 2018-12-08 ENCOUNTER — Encounter: Payer: Self-pay | Admitting: Gastroenterology

## 2018-12-11 ENCOUNTER — Telehealth: Payer: Self-pay | Admitting: Emergency Medicine

## 2018-12-11 NOTE — Telephone Encounter (Signed)
Left message for patient to return call for monitor results.

## 2018-12-20 ENCOUNTER — Encounter: Payer: Self-pay | Admitting: Gastroenterology

## 2018-12-20 ENCOUNTER — Telehealth (INDEPENDENT_AMBULATORY_CARE_PROVIDER_SITE_OTHER): Payer: Medicare HMO | Admitting: Gastroenterology

## 2018-12-20 ENCOUNTER — Other Ambulatory Visit: Payer: Self-pay

## 2018-12-20 VITALS — Ht 67.0 in | Wt 222.0 lb

## 2018-12-20 DIAGNOSIS — K5909 Other constipation: Secondary | ICD-10-CM | POA: Diagnosis not present

## 2018-12-20 DIAGNOSIS — R1031 Right lower quadrant pain: Secondary | ICD-10-CM

## 2018-12-20 DIAGNOSIS — Z800 Family history of malignant neoplasm of digestive organs: Secondary | ICD-10-CM | POA: Diagnosis not present

## 2018-12-20 DIAGNOSIS — K219 Gastro-esophageal reflux disease without esophagitis: Secondary | ICD-10-CM | POA: Diagnosis not present

## 2018-12-20 MED ORDER — DICYCLOMINE HCL 10 MG PO CAPS: ORAL_CAPSULE | ORAL | 3 refills | Status: DC

## 2018-12-20 NOTE — Patient Instructions (Addendum)
If you are age 67 or older, your body mass index should be between 23-30. Your Body mass index is 34.77 kg/m. If this is out of the aforementioned range listed, please consider follow up with your Primary Care Provider.  If you are age 31 or younger, your body mass index should be between 19-25. Your Body mass index is 34.77 kg/m. If this is out of the aformentioned range listed, please consider follow up with your Primary Care Provider.   You have [facility] [place] [address] [address]   You are scheduled on 12/27/18 at [place] should arrive 15 minutes prior to your appointment time for registration. Please follow the written instructions below on the day of your exam:  WARNING: IF YOU ARE ALLERGIC TO IODINE/X-RAY DYE, PLEASE NOTIFY RADIOLOGY IMMEDIATELY AT [phone]! YOU WILL BE GIVEN A 13 HOUR PREMEDICATION PREP.  1) Do not eat or drink anything after 7am (4 hours prior to your test) 2) You have been given 2 bottles of oral contrast to drink. The solution may taste better if refrigerated, but do NOT add ice or any other liquid to this solution. Shake well before drinking.    Drink 1 bottle of contrast @ 9am (2 hours prior to your exam)  Drink 1 bottle of contrast @ 10am (1 hour prior to your exam)  You may take any medications as prescribed with a small amount of water, if necessary. If you take any of the following medications: METFORMIN, GLUCOPHAGE, GLUCOVANCE, AVANDAMET, RIOMET, FORTAMET, [place] MET, JANUMET, GLUMETZA or METAGLIP, you MAY be asked to HOLD this medication 48 hours AFTER the exam.  The purpose of you drinking the oral contrast is to aid in the visualization of your intestinal tract. The contrast solution may cause some diarrhea. Depending on your individual set of symptoms, you may also receive an intravenous injection of x-ray contrast/dye. Plan on being at  [facility] for 30 minutes or longer, depending on the type of exam you are having performed.  This test typically takes 30-45 minutes to complete.  If you have any questions regarding your exam or if you need to reschedule, you may call the CT department at [phone] between the hours of 8:00 am and 5:00 pm, Monday-Friday.  ________________________________________________________________________  We have sent the following medications to your pharmacy for you to pick up at your convenience: [place]  Please come to our office at [address] to pick up your contrast before the day of your test.  Follow up 12 weeks.   Thank you,  Dr. Jackquline Denmark

## 2018-12-20 NOTE — Progress Notes (Signed)
Chief Complaint:   Referring Provider:  Gala Lewandowsky, MD      ASSESSMENT AND PLAN;   #1. RLQ Pain. Nl CBC, CMP 12/2018 #2. Chronic constipation. #3. GERD with small HH. #4. FH of colon cancer (dad at age 67), FH of colonic polyps (mom at age 28). 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  Plan: - CT abdo/pel with p.o. and IV contrast (labs in care everywhere and detailed below, Nl Cr) - If neg, colonoscopy further evaluation.  She is due for colonoscopy this year anyway. - Continue miralax 17g po qd. - Bentyl 10mg  po BID (1/2 hr qAC and qHS).  Start over the weekend.  - FU in 12 weeks.  HPI:    Wendy Gomez is a 68 y.o. female  RLQ pain x over last several years, getting worse over the last 3 mnts, worst at night with assoc back pain.  Describes this as sharp, intermittent, colicky, occasionally relieved by defecation.  Has some associated abdominal bloating. H/O constipation, better with miralax qd.  Has been compliant with MiraLAX.  Would have bowel movement at the frequency of 1/day.  The abdominal pain was worse when she was more constipated.  Does have back pain-chronic in nature.  Attributed to DJD.   No nausea, vomiting, heartburn, regurgitation, odynophagia or dysphagia.  No diarrhea. There is no melena or hematochezia. No unintentional weight loss.  Convinced that her pain is due to pelvic adhesions.  No nonsteroidals.  Used to work in [employer] retired.  Additional past surgical history: -H/O endometriosis, total hysterectomy -Lap chole (Dr Noberto Retort) complicated by postop leak requiring ERCP (Dr Lyndel Safe)  Past GI procedures: -EGD 03/29/2014: Small hiatal hernia, mild gastritis. CLO- neg -Colonoscopy 03/29/2014 (adult, some difficulty): Mild sigmoid diverticulosis, small internal hemorrhoids Past Medical History:  Diagnosis Date  . Anxiety   . Connective tissue disease ([place])   . COPD (chronic obstructive pulmonary disease) ([place])   . Depression   .  Diverticulosis    hx of   . Endometriosis   . GERD (gastroesophageal reflux disease)   . History of Helicobacter pylori infection   . Irritable bowel syndrome   . Thoracic stomach hernia     Past Surgical History:  Procedure Laterality Date  . ABDOMINAL HYSTERECTOMY    . APPENDECTOMY    . BILATERAL OOPHORECTOMY    . BREAST ENHANCEMENT SURGERY    . CHOLECYSTECTOMY    . CHOLECYSTECTOMY    . COLONOSCOPY  03/19/2014   Mild sigmoid diverticulosis. External and internal hemorrhoids. Otherwise normal colonoscopy to TI.   Marland Kitchen ESOPHAGOGASTRODUODENOSCOPY  03/29/2014   Small hiatal hernia. Minimal gastritis.   Marland Kitchen LAPAROTOMY      Family History  Problem Relation Age of Onset  . Hypertension Sister   . Hyperlipidemia Sister   . Diabetes Sister   . Hemochromatosis Sister   . Colon cancer Father   . Colon cancer Paternal Uncle     Social History   Tobacco Use  . Smoking status: Former Smoker    Quit date: 07/08/2007    Years since quitting: 11.4  . Smokeless tobacco: Never Used  Substance Use Topics  . Alcohol use: No  . Drug use: No    Current Outpatient Medications  Medication Sig Dispense Refill  . albuterol (PROVENTIL HFA;VENTOLIN HFA) 108 (90 BASE) MCG/ACT inhaler Inhale 2 puffs into the lungs every 6 (six) hours as needed.    Marland Kitchen aspirin EC 81 MG tablet Take 81 mg by mouth daily.    Marland Kitchen  [place] ELDERBERRY PO Take 1,000 mg by mouth.    . cholecalciferol (VITAMIN D3) 25 MCG (1000 UT) tablet Take 2,000 Units by mouth daily.    . fluticasone (FLONASE) 50 MCG/ACT nasal spray Place 2 sprays into both nostrils as needed.    . metoprolol tartrate (LOPRESSOR) 25 MG tablet Take 12.5 mg by mouth 2 (two) times daily.    . Omega-3 Fatty Acids (FISH OIL) 1000 MG CAPS Take 2 capsules by mouth daily.    Marland Kitchen omeprazole (PRILOSEC) 20 MG capsule Take 20 mg by mouth daily.    . Polyethylene Glycol 3350 (MIRALAX PO) Take by mouth daily.     No current facility-administered medications for this visit.      Allergies  Allergen Reactions  . Morphine And Related Nausea And Vomiting    Review of Systems:  Constitutional: Denies fever, chills, diaphoresis, appetite change and has fatigue.  HEENT: Denies photophobia, eye pain, redness, hearing loss, ear pain, congestion, sore throat, rhinorrhea, sneezing, mouth sores, neck pain, neck stiffness and tinnitus.   Respiratory: Denies SOB, DOE, cough, chest tightness,  and wheezing.   Cardiovascular: Denies chest pain, palpitations and leg swelling.  Genitourinary: Denies dysuria, urgency, frequency, hematuria, flank pain and difficulty urinating.  Musculoskeletal: Has myalgias, back pain, joint swelling, arthralgias and gait problem.  Skin: No rash.  Neurological: Denies dizziness, seizures, syncope, weakness, light-headedness, numbness and headaches.  Hematological: Denies adenopathy. Easy bruising, personal or family bleeding history  Psychiatric/Behavioral: Has anxiety or depression     Physical Exam:    Ht 5\' 7"  (1.702 m)   Wt 222 lb (100.7 kg)   BMI 34.77 kg/m  Filed Weights   12/20/18 0832  Weight: 222 lb (100.7 kg)   televisit  Data Reviewed: I have personally reviewed following labs and imaging studies  Labs-12/13/2018 normal CBC with hemoglobin 15.2, normal CMP with creatinine 0.69.  Normal liver function tests except for total bilirubin 1.4.  Normal hemoglobin A1c.  This service was provided via video ([facility]) telemedicine.  The patient was located at home.  The provider was located in office.  The patient did consent to this telephone visit and is aware of possible charges through their insurance for this visit.  The patient was referred by Dr. Spero Curb.   Time spent on call/review of medical records/review of care everywhere/coordination of care: 45 min   Carmell Austria, MD 12/20/2018, 9:43 AM  Cc: Gala Lewandowsky, MD

## 2018-12-27 ENCOUNTER — Ambulatory Visit (HOSPITAL_BASED_OUTPATIENT_CLINIC_OR_DEPARTMENT_OTHER)
Admission: RE | Admit: 2018-12-27 | Discharge: 2018-12-27 | Disposition: A | Discharge status: Home / Self Care | Payer: Medicare HMO | Source: Ambulatory Visit | Attending: Gastroenterology | Admitting: Gastroenterology

## 2018-12-27 ENCOUNTER — Other Ambulatory Visit: Payer: Self-pay

## 2018-12-27 ENCOUNTER — Encounter (HOSPITAL_BASED_OUTPATIENT_CLINIC_OR_DEPARTMENT_OTHER): Payer: Self-pay

## 2018-12-27 DIAGNOSIS — K5909 Other constipation: Secondary | ICD-10-CM | POA: Diagnosis present

## 2018-12-27 DIAGNOSIS — R1031 Right lower quadrant pain: Secondary | ICD-10-CM | POA: Diagnosis present

## 2018-12-27 MED ORDER — IOHEXOL 300 MG/ML  SOLN
100.0000 mL | Freq: Once | INTRAMUSCULAR | Status: AC | PRN
  Administered 2018-12-27: 11:00:00 100 mL via INTRAVENOUS

## 2018-12-28 ENCOUNTER — Telehealth: Payer: Self-pay | Admitting: Gastroenterology

## 2018-12-28 DIAGNOSIS — K219 Gastro-esophageal reflux disease without esophagitis: Secondary | ICD-10-CM

## 2018-12-28 DIAGNOSIS — Z800 Family history of malignant neoplasm of digestive organs: Secondary | ICD-10-CM

## 2018-12-28 DIAGNOSIS — K5909 Other constipation: Secondary | ICD-10-CM

## 2018-12-28 DIAGNOSIS — R1031 Right lower quadrant pain: Secondary | ICD-10-CM

## 2018-12-28 NOTE — Telephone Encounter (Signed)
Yes she is scheduled on 01/01/2019 and I cancelled the pre visit appt Will you please let her know? Thank you

## 2018-12-28 NOTE — Telephone Encounter (Signed)
Called and spoke with patient-patient informed of colonoscopy procedure being scheduled; patient is agreeable with plan of care and understands to notify the office if she is unable to obtain her colonoscopy prep instructions; Patient verbalized understanding of information/instructions;  Patient was advised to call the office at [phone] if questions/concerns arise;

## 2018-12-29 ENCOUNTER — Telehealth: Payer: Self-pay | Admitting: Gastroenterology

## 2018-12-29 NOTE — Telephone Encounter (Signed)

## 2018-12-29 NOTE — Telephone Encounter (Signed)
Called and spoke with patient-walked patient through signing into [facility] - patient was able to see prep instructions and read the first few lines of text in the document to assure RN she had the correct information; Patient verbalized understanding of information/instructions; patient was advised to call back should questions/concerns arise;

## 2018-12-29 NOTE — Telephone Encounter (Signed)
Pt called stating that she has not received instructions for prep yet, she said that you were going to email it to her.

## 2019-01-01 ENCOUNTER — Other Ambulatory Visit: Payer: Self-pay

## 2019-01-01 ENCOUNTER — Encounter: Payer: Self-pay | Admitting: Gastroenterology

## 2019-01-01 ENCOUNTER — Ambulatory Visit (AMBULATORY_SURGERY_CENTER): Payer: Medicare HMO | Admitting: Gastroenterology

## 2019-01-01 VITALS — BP 127/77 | HR 53 | Temp 98.9°F | Resp 17 | Ht 67.0 in | Wt 222.0 lb

## 2019-01-01 DIAGNOSIS — K648 Other hemorrhoids: Secondary | ICD-10-CM | POA: Diagnosis not present

## 2019-01-01 DIAGNOSIS — K635 Polyp of colon: Secondary | ICD-10-CM

## 2019-01-01 DIAGNOSIS — D125 Benign neoplasm of sigmoid colon: Secondary | ICD-10-CM

## 2019-01-01 DIAGNOSIS — K5730 Diverticulosis of large intestine without perforation or abscess without bleeding: Secondary | ICD-10-CM | POA: Diagnosis not present

## 2019-01-01 DIAGNOSIS — R1031 Right lower quadrant pain: Secondary | ICD-10-CM

## 2019-01-01 DIAGNOSIS — D124 Benign neoplasm of descending colon: Secondary | ICD-10-CM

## 2019-01-01 MED ORDER — SODIUM CHLORIDE 0.9 % IV SOLN: 500.0000 mL | Freq: Once | INTRAVENOUS | Status: DC

## 2019-01-01 NOTE — Op Note (Signed)
[facility] Patient Name: Wendy Gomez Procedure Date: 01/01/2019 7:21 AM MRN: [id number] Endoscopist: Jackquline Denmark , MD Age: 68 Referring MD:  Date of Birth: 1950-07-13 Gender: Female Account #: [id number] Procedure:                Colonoscopy Indications:              #1. RLQ Pain. Nl CBC, CMP 12/2018 with neg CT.                           #2. Chronic constipation.                           #3. FH of colon cancer (dad at age 19), FH of                            colonic polyps (mom at age 31). Medicines:                Monitored Anesthesia Care Procedure:                Pre-Anesthesia Assessment:                           - Prior to the procedure, a History and Physical                            was performed, and patient medications and                            allergies were reviewed. The patient's tolerance of                            previous anesthesia was also reviewed. The risks                            and benefits of the procedure and the sedation                            options and risks were discussed with the patient.                            All questions were answered, and informed consent                            was obtained. Prior Anticoagulants: The patient has                            taken no previous anticoagulant or antiplatelet                            agents. ASA Grade Assessment: II - A patient with                            mild systemic disease. After reviewing the risks  and benefits, the patient was deemed in                            satisfactory condition to undergo the procedure.                           After obtaining informed consent, the colonoscope                            was passed under direct vision. Throughout the                            procedure, the patient's blood pressure, pulse, and                            oxygen saturations were monitored continuously. The                Colonoscope was introduced through the anus and                            advanced to the 2 cm into the ileum. The                            colonoscopy was performed without difficulty. The                            patient tolerated the procedure well. The quality                            of the bowel preparation was good. The terminal                            ileum, ileocecal valve, appendiceal orifice, and                            rectum were photographed. Scope In: 7:42:04 AM Scope Out: 8:06:29 AM Scope Withdrawal Time: 0 hours 16 minutes 56 seconds  Total Procedure Duration: 0 hours 24 minutes 25 seconds  Findings:                 Five sessile polyps were found in the proximal                            sigmoid colon and mid descending colon. The polyps                            were 4 to 10 mm in size. These polyps were removed                            with a cold snare. Resection and retrieval were                            complete. Estimated blood loss: none.  Few small-mouthed diverticula were found in the                            sigmoid colon.                           Non-bleeding internal hemorrhoids were found during                            retroflexion. The hemorrhoids were small.                           The exam was otherwise without abnormality on                            direct and retroflexion views.                           The terminal ileum appeared normal. Complications:            No immediate complications. Estimated Blood Loss:     Estimated blood loss: none. Impression:               -Colonic polyps status post polypectomy.                           -Mild sigmoid diverticulosis.                           -Otherwise normal colonoscopy to TI. Recommendation:           - Patient has a contact number available for                            emergencies. The signs and symptoms of potential                             delayed complications were discussed with the                            patient. Return to normal activities tomorrow.                            Written discharge instructions were provided to the                            patient.                           - Resume previous diet.                           - Continue MiraLAX 17 g p.o. once a day. Continue                            rest of the medications.                           -  Await pathology results.                           - Repeat colonoscopy for surveillance based on                            pathology results.                           - Return to GI clinic in 12 weeks. Jackquline Denmark, MD 01/01/2019 8:13:58 AM This report has been signed electronically.

## 2019-01-01 NOTE — Patient Instructions (Addendum)
YOU HAD AN ENDOSCOPIC PROCEDURE TODAY AT [facility]:   Refer to the procedure report that was given to you for any specific questions about what was found during the examination.  If the procedure report does not answer your questions, please call your gastroenterologist to clarify.  If you requested that your care partner not be given the details of your procedure findings, then the procedure report has been included in a sealed envelope for you to review at your convenience later.  YOU SHOULD EXPECT: Some feelings of bloating in the abdomen. Passage of more gas than usual.  Walking can help get rid of the air that was put into your GI tract during the procedure and reduce the bloating. If you had a lower endoscopy (such as a colonoscopy or flexible sigmoidoscopy) you may notice spotting of blood in your stool or on the toilet paper. If you underwent a bowel prep for your procedure, you may not have a normal bowel movement for a few days.  Please Note:  You might notice some irritation and congestion in your nose or some drainage.  This is from the oxygen used during your procedure.  There is no need for concern and it should clear up in a day or so.  SYMPTOMS TO REPORT IMMEDIATELY:   Following lower endoscopy (colonoscopy or flexible sigmoidoscopy):  Excessive amounts of blood in the stool  Significant tenderness or worsening of abdominal pains  Swelling of the abdomen that is new, acute  Fever of 100F or higher    For urgent or emergent issues, a gastroenterologist can be reached at any hour by calling [phone].   DIET:  We do recommend a small meal at first, but then you may proceed to your regular diet.  Drink plenty of fluids but you should avoid alcoholic beverages for 24 hours.  ACTIVITY:  You should plan to take it easy for the rest of today and you should NOT DRIVE or use heavy machinery until tomorrow (because of the sedation medicines used during the test).     FOLLOW UP: Our staff will call the number listed on your records 48-72 hours following your procedure to check on you and address any questions or concerns that you may have regarding the information given to you following your procedure. If we do not reach you, we will leave a message.  We will attempt to reach you two times.  During this call, we will ask if you have developed any symptoms of COVID 19. If you develop any symptoms (ie: fever, flu-like symptoms, shortness of breath, cough etc.) before then, please call [phone].  If you test positive for Covid 19 in the 2 weeks post procedure, please call and report this information to [place].    If any biopsies were taken you will be contacted by phone or by letter within the next 1-3 weeks.  Please call us at [phone] if you have not heard about the biopsies in 3 weeks.    SIGNATURES/CONFIDENTIALITY: You and/or your care partner have signed paperwork which will be entered into your electronic medical record.  These signatures attest to the fact that that the information above on your After Visit Summary has been reviewed and is understood.  Full responsibility of the confidentiality of this discharge information lies with you and/or your care-partner.    Handouts were given to you on polyps, diverticulosis, and hemorrhoids. Continue MIRALAX 17 gm by mouth once a day. You may resume your  current medications today. Await biopsy results. Call Dr. Lyndel Safe and let him know how you are in 1 week. Return for office appointment in 12 week.  The office will call you to set up this appointment.  Please call if any questions or concerns.

## 2019-01-01 NOTE — Progress Notes (Addendum)
Pt c/o abdominal cramping.  Abdomen is soft.  Encouraged pt to bear down and pass flatus.  Pt did so. Maw  Per Dr. Lyndel Safe given pt the written results from her CT scan of abdomin and pelvis 05-29-19.  Done. Maw  No complaints noted on discharge. maw

## 2019-01-01 NOTE — Progress Notes (Deleted)
Pt's states no medical or surgical changes since previsit or office visit. 

## 2019-01-01 NOTE — Progress Notes (Signed)
PT taken to PACU. Monitors in place. VSS. Report given to RN. 

## 2019-01-01 NOTE — Progress Notes (Signed)
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

## 2019-01-01 NOTE — Progress Notes (Signed)
Called to room to assist during endoscopic procedure.  Patient ID and intended procedure confirmed with present staff. Received instructions for my participation in the procedure from the performing physician.  

## 2019-01-01 NOTE — Progress Notes (Signed)
Fayrene Fearing took temp and Rica Mote took vitals.

## 2019-01-03 ENCOUNTER — Ambulatory Visit (INDEPENDENT_AMBULATORY_CARE_PROVIDER_SITE_OTHER): Payer: Medicare HMO | Admitting: Cardiology

## 2019-01-03 ENCOUNTER — Other Ambulatory Visit: Payer: Self-pay

## 2019-01-03 ENCOUNTER — Telehealth: Payer: Self-pay | Admitting: *Deleted

## 2019-01-03 ENCOUNTER — Encounter: Payer: Self-pay | Admitting: Cardiology

## 2019-01-03 VITALS — BP 116/74 | HR 53 | Ht 67.0 in | Wt 226.0 lb

## 2019-01-03 DIAGNOSIS — R0789 Other chest pain: Secondary | ICD-10-CM

## 2019-01-03 DIAGNOSIS — R002 Palpitations: Secondary | ICD-10-CM | POA: Diagnosis not present

## 2019-01-03 DIAGNOSIS — I10 Essential (primary) hypertension: Secondary | ICD-10-CM

## 2019-01-03 NOTE — Telephone Encounter (Signed)
  Follow up Call-  Call back number 01/01/2019  Post procedure Call Back phone  # [phone]  Permission to leave phone message Yes  Some recent data might be hidden     Patient questions:  Do you have a fever, pain , or abdominal swelling? Yes  Pain Score  3 * RLQ pain which she had prior to procedure  Have you tolerated food without any problems? Yes.    Have you been able to return to your normal activities? Yes.    Do you have any questions about your discharge instructions: Diet   No. Medications  No. Follow up visit  No.  Do you have questions or concerns about your Care? Yes.   Pt had questions re diverticulosis vs diverticulitis, referred her to her handout that was given in recovery and reviewed this with her. She was also asking about her EGD, reminded her she needs to make a f/u appt in 12 weeks. Actions: * If pain score is 4 or above: No action needed, pain <4.   1. Have you developed a fever since your procedure? no  2.   Have you had an respiratory symptoms (SOB or cough) since your procedure? no  3.   Have you tested positive for COVID 19 since your procedure no  4.   Have you had any family members/close contacts diagnosed with the COVID 19 since your procedure?  no   If yes to any of these questions please route to Joylene John, RN and Alphonsa Gin, Therapist, sports.

## 2019-01-03 NOTE — Progress Notes (Signed)
Cardiology Office Note:    Date:  01/03/2019   ID:  Wendy Gomez, DOB 02-16-1951, MRN [id number]  PCP:  Gala Lewandowsky, MD  Cardiologist:  Jenne Campus, MD    Referring MD: Gala Lewandowsky, MD   Chief Complaint  Patient presents with  . 1 month follow up  Doing better my palpitations are much better  History of Present Illness:    Wendy Gomez is a 68 y.o. female with palpitations.  She did wear monitor which showed runs of narrow complex tachycardia.  One episode of wide-complex tachycardia somewhat irregular.  I put her on beta-blocker she is feeling much better but still described to have some palpitations.  She also described to have some movement her dizzy spells.  This is not worse since I put her on metoprolol but did not get any better.  Echocardiogram was done which showed normal left ventricular ejection fraction.  There is no syncope  Past Medical History:  Diagnosis Date  . Anxiety   . Connective tissue disease ([place])   . COPD (chronic obstructive pulmonary disease) ([place])   . Depression   . Diverticulosis    hx of   . Endometriosis   . GERD (gastroesophageal reflux disease)   . History of Helicobacter pylori infection   . Irritable bowel syndrome   . Thoracic stomach hernia     Past Surgical History:  Procedure Laterality Date  . ABDOMINAL HYSTERECTOMY    . APPENDECTOMY    . BILATERAL OOPHORECTOMY    . BREAST ENHANCEMENT SURGERY    . CHOLECYSTECTOMY    . CHOLECYSTECTOMY    . COLONOSCOPY  03/19/2014   Mild sigmoid diverticulosis. External and internal hemorrhoids. Otherwise normal colonoscopy to TI.   Marland Kitchen ESOPHAGOGASTRODUODENOSCOPY  03/29/2014   Small hiatal hernia. Minimal gastritis.   Marland Kitchen LAPAROTOMY      Current Medications: Current Meds  Medication Sig  . albuterol (PROVENTIL HFA;VENTOLIN HFA) 108 (90 BASE) MCG/ACT inhaler Inhale 2 puffs into the lungs every 6 (six) hours as needed.  Marland Kitchen aspirin EC 81 MG tablet Take 81 mg by mouth daily.  Marland Kitchen [place]  ELDERBERRY PO Take 1,000 mg by mouth.  . cholecalciferol (VITAMIN D3) 25 MCG (1000 UT) tablet Take 2,000 Units by mouth daily.  Marland Kitchen dicyclomine (BENTYL) 10 MG capsule Take one twice daily 1/2 hour before meals and at bedtime.  . fluticasone (FLONASE) 50 MCG/ACT nasal spray Place 2 sprays into both nostrils as needed.  . metoprolol tartrate (LOPRESSOR) 25 MG tablet Take 25 mg by mouth 2 (two) times daily.   . Omega-3 Fatty Acids (FISH OIL) 1000 MG CAPS Take 2 capsules by mouth daily.  Marland Kitchen omeprazole (PRILOSEC) 20 MG capsule Take 20 mg by mouth daily.  . Polyethylene Glycol 3350 (MIRALAX PO) Take by mouth daily.     Allergies:   Morphine and related   Social History   Socioeconomic History  . Marital status: Divorced    Spouse name: Not on file  . Number of children: Not on file  . Years of education: Not on file  . Highest education level: Not on file  Occupational History  . Not on file  Social Needs  . Financial resource strain: Not on file  . Food insecurity    Worry: Not on file    Inability: Not on file  . Transportation needs    Medical: Not on file    Non-medical: Not on file  Tobacco Use  . Smoking status: Former Smoker  Quit date: 07/08/2007    Years since quitting: 11.4  . Smokeless tobacco: Never Used  Substance and Sexual Activity  . Alcohol use: No  . Drug use: No  . Sexual activity: Not on file  Lifestyle  . Physical activity    Days per week: Not on file    Minutes per session: Not on file  . Stress: Not on file  Relationships  . Social Herbalist on phone: Not on file    Gets together: Not on file    Attends religious service: Not on file    Active member of club or organization: Not on file    Attends meetings of clubs or organizations: Not on file    Relationship status: Not on file  Other Topics Concern  . Not on file  Social History Narrative  . Not on file     Family History: The patient's family history includes Colon cancer in  her father and paternal uncle; Diabetes in her sister; Hemochromatosis in her sister; Hyperlipidemia in her sister; Hypertension in her sister. There is no history of Rectal cancer, Stomach cancer, or Esophageal cancer. ROS:   Please see the history of present illness.    All 14 point review of systems negative except as described per history of present illness  EKGs/Labs/Other Studies Reviewed:      Recent Labs: No results found for requested labs within last 8760 hours.  Recent Lipid Panel No results found for: CHOL, TRIG, HDL, CHOLHDL, VLDL, LDLCALC, LDLDIRECT  Physical Exam:    VS:  BP 116/74   Pulse (!) 53   Ht 5\' 7"  (1.702 m)   Wt 226 lb (102.5 kg)   SpO2 97%   BMI 35.40 kg/m     Wt Readings from Last 3 Encounters:  01/03/19 226 lb (102.5 kg)  01/01/19 222 lb (100.7 kg)  12/20/18 222 lb (100.7 kg)     GEN:  Well nourished, well developed in no acute distress HEENT: Normal NECK: No JVD; No carotid bruits LYMPHATICS: No lymphadenopathy CARDIAC: RRR, no murmurs, no rubs, no gallops RESPIRATORY:  Clear to auscultation without rales, wheezing or rhonchi  ABDOMEN: Soft, non-tender, non-distended MUSCULOSKELETAL:  No edema; No deformity  SKIN: Warm and dry LOWER EXTREMITIES: no swelling NEUROLOGIC:  Alert and oriented x 3 PSYCHIATRIC:  Normal affect   ASSESSMENT:    1. Palpitations   2. Essential hypertension   3. Atypical chest pain    PLAN:    In order of problems listed above:  1. Palpitations.  I will ask her to wear monitor again and this is mostly to check make sure her sinus node recovery time is fine after putting her on beta-blocker.  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  Therefore we need to make sure we get this right.  Based on that I will decide if the way to go is to increase dose of beta-blocker or maybe adding calcium channel blocker.  Ablation need to be considered as well.  However, I do not  think we have that point yet. 2. Essential hypertension blood pressure well controlled 3. Atypical chest pain doing well from that point review denies having any   Medication Adjustments/Labs and Tests Ordered: Current medicines are reviewed at length with the patient today.  Concerns regarding medicines are outlined above.  No orders of the defined types were placed in this encounter.  Medication changes: No  orders of the defined types were placed in this encounter.   Signed, Park Liter, MD, [facility] 01/03/2019 10:16 AM    [place]

## 2019-01-03 NOTE — Patient Instructions (Signed)
Medication Instructions:  Your physician recommends that you continue on your current medications as directed. Please refer to the Current Medication list given to you today.  If you need a refill on your cardiac medications before your next appointment, please call your pharmacy.   Lab work: None.  If you have labs (blood work) drawn today and your tests are completely normal, you will receive your results only by: Marland Kitchen MyChart Message (if you have MyChart) OR . A paper copy in the mail If you have any lab test that is abnormal or we need to change your treatment, we will call you to review the results.  Testing/Procedures: Your physician has recommended that you wear a holter monitor. Holter monitors are medical devices that record the heart's electrical activity. Doctors most often use these monitors to diagnose arrhythmias. Arrhythmias are problems with the speed or rhythm of the heartbeat. The monitor is a small, portable device. You can wear one while you do your normal daily activities. This is usually used to diagnose what is causing palpitations/syncope (passing out). Wear for 3 days.     Follow-Up: At [facility], you and your health needs are our priority.  As part of our continuing mission to provide you with exceptional heart care, we have created designated Provider Care Teams.  These Care Teams include your primary Cardiologist (physician) and Advanced Practice Providers (APPs -  Physician Assistants and Nurse Practitioners) who all work together to provide you with the care you need, when you need it. You will need a follow up appointment in 3 months.

## 2019-01-03 NOTE — Addendum Note (Signed)
Addended by: Ashok Norris on: 01/03/2019 10:24 AM   Modules accepted: Orders

## 2019-01-03 NOTE — Addendum Note (Signed)
Addended by: Linna Hoff R on: 01/03/2019 10:30 AM   Modules accepted: Orders

## 2019-01-04 ENCOUNTER — Encounter: Payer: Self-pay | Admitting: Gastroenterology

## 2019-01-09 ENCOUNTER — Other Ambulatory Visit (INDEPENDENT_AMBULATORY_CARE_PROVIDER_SITE_OTHER): Payer: Medicare HMO

## 2019-01-09 DIAGNOSIS — R002 Palpitations: Secondary | ICD-10-CM | POA: Diagnosis not present

## 2019-02-05 ENCOUNTER — Telehealth: Payer: Self-pay | Admitting: Cardiology

## 2019-02-05 ENCOUNTER — Other Ambulatory Visit: Payer: Self-pay | Admitting: Gastroenterology

## 2019-02-05 MED ORDER — METOPROLOL TARTRATE 25 MG PO TABS: 12.5000 mg | ORAL_TABLET | Freq: Two times a day (BID) | ORAL | 1 refills | Status: AC

## 2019-02-05 MED ORDER — DILTIAZEM HCL ER COATED BEADS 120 MG PO CP24: 120.0000 mg | ORAL_CAPSULE | Freq: Every day | ORAL | 1 refills | Status: DC

## 2019-02-05 NOTE — Telephone Encounter (Signed)
Please call patient with results of monitor 

## 2019-02-05 NOTE — Telephone Encounter (Signed)
Patient informed of monitor results and to decrease metoprolol succinate to 12.5 mg twice daily and start Cardizem 120 mg daily. She verbally understands no further questions.

## 2019-02-05 NOTE — Addendum Note (Signed)
Addended by: Ashok Norris on: 02/05/2019 05:13 PM   Modules accepted: Orders

## 2019-04-04 ENCOUNTER — Ambulatory Visit: Payer: Medicare HMO | Admitting: Cardiology

## 2019-04-11 DIAGNOSIS — Z20822 Contact with and (suspected) exposure to covid-19: Secondary | ICD-10-CM | POA: Insufficient documentation

## 2019-05-04 ENCOUNTER — Ambulatory Visit: Payer: Medicare HMO | Admitting: Cardiology

## 2019-05-30 ENCOUNTER — Ambulatory Visit (INDEPENDENT_AMBULATORY_CARE_PROVIDER_SITE_OTHER): Payer: Medicare HMO | Admitting: Cardiology

## 2019-05-30 ENCOUNTER — Other Ambulatory Visit: Payer: Self-pay

## 2019-05-30 ENCOUNTER — Encounter: Payer: Self-pay | Admitting: Cardiology

## 2019-05-30 VITALS — BP 130/66 | HR 64 | Ht 67.0 in | Wt 241.2 lb

## 2019-05-30 DIAGNOSIS — Z8679 Personal history of other diseases of the circulatory system: Secondary | ICD-10-CM

## 2019-05-30 DIAGNOSIS — I10 Essential (primary) hypertension: Secondary | ICD-10-CM

## 2019-05-30 DIAGNOSIS — R0789 Other chest pain: Secondary | ICD-10-CM | POA: Diagnosis not present

## 2019-05-30 DIAGNOSIS — R002 Palpitations: Secondary | ICD-10-CM

## 2019-05-30 NOTE — Patient Instructions (Signed)
Medication Instructions:  Your physician recommends that you continue on your current medications as directed. Please refer to the Current Medication list given to you today.  *If you need a refill on your cardiac medications before your next appointment, please call your pharmacy*  Lab Work: None.  If you have labs (blood work) drawn today and your tests are completely normal, you will receive your results only by: . MyChart Message (if you have MyChart) OR . A paper copy in the mail If you have any lab test that is abnormal or we need to change your treatment, we will call you to review the results.  Testing/Procedures: Your physician has requested that you have a carotid duplex. This test is an ultrasound of the carotid arteries in your neck. It looks at blood flow through these arteries that supply the brain with blood. Allow one hour for this exam. There are no restrictions or special instructions.    Follow-Up: At [facility], you and your health needs are our priority.  As part of our continuing mission to provide you with exceptional heart care, we have created designated Provider Care Teams.  These Care Teams include your primary Cardiologist (physician) and Advanced Practice Providers (APPs -  Physician Assistants and Nurse Practitioners) who all work together to provide you with the care you need, when you need it.  Your next appointment:   6 month(s)  The format for your next appointment:   In Person  Provider:   Robert Krasowski, MD  Other Instructions  

## 2019-05-30 NOTE — Progress Notes (Signed)
Cardiology Office Note:    Date:  05/30/2019   ID:  Lizzie Gitchell [place], [place] 03-13-1951, MRN [id number]  PCP:  Gala Lewandowsky, MD  Cardiologist:  Jenne Campus, MD    Referring MD: Gala Lewandowsky, MD   Chief Complaint  Patient presents with  . Follow-up  Doing fine  History of Present Illness:    Wendy Gomez is a 68 y.o. female with past medical history significant for palpitations, supraventricular tachycardia, also some episode of dizziness.  Quite extensive evaluation has been done which included 2 monitors.  She seems to be doing well still described to have some rare episode of palpitations but that does not bother her too much and she is happy the way she feels she is already on beta-blocker as well as calcium channel blocker will continue.  The purpose of last monitor was to make sure her sinus node recovery time is reasonable.  And it is.  Therefore, we will continue present management.  Today she told me that 3 years ago she had carotid ultrasound done which show 50% blockages on both sides.  We will need to repeat the test.  Past Medical History:  Diagnosis Date  . Anxiety   . Connective tissue disease ([place])   . COPD (chronic obstructive pulmonary disease) ([place])   . Depression   . Diverticulosis    hx of   . Endometriosis   . GERD (gastroesophageal reflux disease)   . History of Helicobacter pylori infection   . Irritable bowel syndrome   . Thoracic stomach hernia     Past Surgical History:  Procedure Laterality Date  . ABDOMINAL HYSTERECTOMY    . APPENDECTOMY    . BILATERAL OOPHORECTOMY    . BREAST ENHANCEMENT SURGERY    . CHOLECYSTECTOMY    . CHOLECYSTECTOMY    . COLONOSCOPY  03/19/2014   Mild sigmoid diverticulosis. External and internal hemorrhoids. Otherwise normal colonoscopy to TI.   Marland Kitchen ESOPHAGOGASTRODUODENOSCOPY  03/29/2014   Small hiatal hernia. Minimal gastritis.   Marland Kitchen LAPAROTOMY      Current Medications: Current Meds   Medication Sig  . albuterol (PROVENTIL HFA;VENTOLIN HFA) 108 (90 BASE) MCG/ACT inhaler Inhale 2 puffs into the lungs every 6 (six) hours as needed.  Marland Kitchen aspirin EC 81 MG tablet Take 81 mg by mouth daily.  . cholecalciferol (VITAMIN D3) 25 MCG (1000 UT) tablet Take 2,000 Units by mouth daily.  Marland Kitchen dicyclomine (BENTYL) 10 MG capsule TAKE 1 CAPSULE BY MOUTH TWICE DAILY 1/2 HOUR BEFORE MEALS AND AT BEDTIME.  Marland Kitchen diltiazem (CARDIZEM CD) 120 MG 24 hr capsule Take 1 capsule (120 mg total) by mouth daily.  . fluticasone (FLONASE) 50 MCG/ACT nasal spray Place 2 sprays into both nostrils as needed.  . metoprolol tartrate (LOPRESSOR) 25 MG tablet Take 0.5 tablets (12.5 mg total) by mouth 2 (two) times daily.  . Omega-3 Fatty Acids (FISH OIL) 1000 MG CAPS Take 2 capsules by mouth daily.  Marland Kitchen omeprazole (PRILOSEC) 20 MG capsule Take 20 mg by mouth daily.  . Polyethylene Glycol 3350 (MIRALAX PO) Take by mouth daily.     Allergies:   Morphine and related   Social History   Socioeconomic History  . Marital status: Divorced    Spouse name: Not on file  . Number of children: Not on file  . Years of education: Not on file  . Highest education level: Not on file  Occupational History  . Not on file  Tobacco Use  . Smoking  status: Former Smoker    Quit date: 07/08/2007    Years since quitting: 11.9  . Smokeless tobacco: Never Used  Substance and Sexual Activity  . Alcohol use: No  . Drug use: No  . Sexual activity: Not on file  Other Topics Concern  . Not on file  Social History Narrative  . Not on file   Social Determinants of Health   Financial Resource Strain:   . Difficulty of Paying Living Expenses: Not on file  Food Insecurity:   . Worried About Charity fundraiser in the Last Year: Not on file  . Ran Out of Food in the Last Year: Not on file  Transportation Needs:   . Lack of Transportation (Medical): Not on file  . Lack of Transportation (Non-Medical): Not on file  Physical Activity:   .  Days of Exercise per Week: Not on file  . Minutes of Exercise per Session: Not on file  Stress:   . Feeling of Stress : Not on file  Social Connections:   . Frequency of Communication with Friends and Family: Not on file  . Frequency of Social Gatherings with Friends and Family: Not on file  . Attends Religious Services: Not on file  . Active Member of Clubs or Organizations: Not on file  . Attends Archivist Meetings: Not on file  . Marital Status: Not on file     Family History: The patient's family history includes Colon cancer in her father and paternal uncle; Diabetes in her sister; Hemochromatosis in her sister; Hyperlipidemia in her sister; Hypertension in her sister. There is no history of Rectal cancer, Stomach cancer, or Esophageal cancer. ROS:   Please see the history of present illness.    All 14 point review of systems negative except as described per history of present illness  EKGs/Labs/Other Studies Reviewed:      Recent Labs: No results found for requested labs within last 8760 hours.  Recent Lipid Panel No results found for: CHOL, TRIG, HDL, CHOLHDL, VLDL, LDLCALC, LDLDIRECT  Physical Exam:    VS:  BP 130/66   Pulse 64   Ht 5\' 7"  (1.702 m)   Wt 241 lb 3.2 oz (109.4 kg)   SpO2 98%   BMI 37.78 kg/m     Wt Readings from Last 3 Encounters:  05/30/19 241 lb 3.2 oz (109.4 kg)  01/03/19 226 lb (102.5 kg)  01/01/19 222 lb (100.7 kg)     GEN:  Well nourished, well developed in no acute distress HEENT: Normal NECK: No JVD; No carotid bruits LYMPHATICS: No lymphadenopathy CARDIAC: RRR, no murmurs, no rubs, no gallops RESPIRATORY:  Clear to auscultation without rales, wheezing or rhonchi  ABDOMEN: Soft, non-tender, non-distended MUSCULOSKELETAL:  No edema; No deformity  SKIN: Warm and dry LOWER EXTREMITIES: no swelling NEUROLOGIC:  Alert and oriented x 3 PSYCHIATRIC:  Normal affect   ASSESSMENT:    1. Palpitations   2. Atypical chest pain    3. Essential hypertension    PLAN:    In order of problems listed above:  1. Palpitations doing well from that point review happy the way she feels.  I talked to her that if this palpitation become more bothersome we can increase dose of Cardizem from 1 20-1 80.  She will let us know if this is what happening.  In the meantime we will continue present management. 2. Atypical chest pain.  Denies having any. 3. Essential hypertension blood pressure well controlled continue present  management. 4. History of carotic arterial disease.  We will schedule her to have carotic ultrasound.  Apparently 3 years ago cardiac ultrasound being done which showed 50% blockages both sides.   Medication Adjustments/Labs and Tests Ordered: Current medicines are reviewed at length with the patient today.  Concerns regarding medicines are outlined above.  No orders of the defined types were placed in this encounter.  Medication changes: No orders of the defined types were placed in this encounter.   Signed, Park Liter, MD, [facility] 05/30/2019 9:18 AM    [place]

## 2019-05-30 NOTE — Addendum Note (Signed)
Addended by: Ashok Norris on: 05/30/2019 09:26 AM   Modules accepted: Orders

## 2019-07-26 ENCOUNTER — Other Ambulatory Visit: Payer: Self-pay | Admitting: Cardiology

## 2019-10-31 ENCOUNTER — Institutional Professional Consult (permissible substitution): Payer: Medicare HMO | Admitting: Pulmonary Disease

## 2019-11-20 ENCOUNTER — Other Ambulatory Visit: Payer: Self-pay | Admitting: Cardiology

## 2019-12-12 ENCOUNTER — Ambulatory Visit (INDEPENDENT_AMBULATORY_CARE_PROVIDER_SITE_OTHER): Payer: Medicare HMO

## 2019-12-12 ENCOUNTER — Ambulatory Visit: Payer: Medicare HMO | Admitting: Pulmonary Disease

## 2019-12-12 ENCOUNTER — Other Ambulatory Visit: Payer: Self-pay

## 2019-12-12 ENCOUNTER — Encounter: Payer: Self-pay | Admitting: Pulmonary Disease

## 2019-12-12 VITALS — BP 134/72 | HR 64 | Temp 98.0°F | Ht 67.0 in | Wt 254.6 lb

## 2019-12-12 DIAGNOSIS — G4733 Obstructive sleep apnea (adult) (pediatric): Secondary | ICD-10-CM

## 2019-12-12 DIAGNOSIS — R0600 Dyspnea, unspecified: Secondary | ICD-10-CM

## 2019-12-12 DIAGNOSIS — R0609 Other forms of dyspnea: Secondary | ICD-10-CM | POA: Insufficient documentation

## 2019-12-12 NOTE — Assessment & Plan Note (Addendum)
Cardiac testing was normal including echo a year ago, no evidence of pulmonary hypertension or valvular disease .  She does have mild pedal edema that is unexplained  We will rule out pulmonary disease by doing chest x-ray and PFTs. She does not seem to have ILD on exam  Meanwhile continue using albuterol.  If she does have significant obstructive lung disease then will suggest LABA/LAMA combination

## 2019-12-12 NOTE — Assessment & Plan Note (Signed)
She seems to have been diagnosed with OSA many years ago.  Prior sleep studies not available.  Also bed partner history is not available since she lives by herself Given excessive daytime somnolence, narrow pharyngeal exam, witnessed apneas & loud snoring, obstructive sleep apnea is very likely & an overnight polysomnogram will be scheduled as a home study. The pathophysiology of obstructive sleep apnea , it's cardiovascular consequences & modes of treatment including CPAP were discused with the patient in detail & they evidenced understanding.  Alternate diagnosis would be circadian rhythm disorder since she has been waking up at 3 AM for the last 2 and half years due to her work. She would be willing to use a CPAP machine if needed

## 2019-12-12 NOTE — Patient Instructions (Signed)
Schedule home sleep test.  Chest x-ray today. Schedule PFTs

## 2019-12-12 NOTE — Progress Notes (Signed)
Subjective:    Patient ID: Wendy Gomez, female    DOB: Oct 26, 1950, 69 y.o.   MRN: [id number]  HPI  69 year old remote smoker presents for evaluation of dyspnea on exertion and sleep disordered breathing. She was diagnosed with COPD many years ago in [place] and only uses albuterol MDI.  She was given an Advair inhaler sample but this was too expensive and she does not remember if this helped or not.  She smoked about 1 pack/day for about 25 years before she quit in 2009.  She has a history of recurrent pneumonia and bronchitis many years ago over the last couple of years have been good. She reports dyspnea on exertion sometimes on walking 2 blocks or 100 yards.  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  ESS 3 Bedtime can be anywhere between 7 PM to 10 PM, she sleeps on her left side with a Biopty pillow, reports 3-4 nocturnal awakenings and is out of bed anywhere from 3 to 5 AM feeling tired without dryness of mouth or headaches. She has gained 50 pounds in the last 2 years.  PMH -cardiac evaluation for palpitations including echo that was normal, on Cardizem and metoprolol -Hereditary hemochromatosis requiring 2 weekly phlebotomy, her sister also has this condition, last hemoglobin 08/2019 was 14.1  Significant tests/ events reviewed  CT abdomen 12/2018 clear lungs Echocardiogram 11/2018 normal LVEF, normal valves   I have reviewed PCP notes and prior records in epic  Past Medical History:  Diagnosis Date  . Anxiety   . Connective tissue disease ([place])   . COPD (chronic obstructive pulmonary disease) ([place])   . Depression   . Diverticulosis    hx of   . Endometriosis   . GERD (gastroesophageal reflux disease)   . History of Helicobacter pylori infection   . Irritable bowel syndrome   . Thoracic stomach hernia    Past Surgical History:  Procedure Laterality Date  . ABDOMINAL HYSTERECTOMY      . APPENDECTOMY    . BILATERAL OOPHORECTOMY    . BREAST ENHANCEMENT SURGERY    . CHOLECYSTECTOMY    . CHOLECYSTECTOMY    . COLONOSCOPY  03/19/2014   Mild sigmoid diverticulosis. External and internal hemorrhoids. Otherwise normal colonoscopy to TI.   Marland Kitchen ESOPHAGOGASTRODUODENOSCOPY  03/29/2014   Small hiatal hernia. Minimal gastritis.   Marland Kitchen LAPAROTOMY      Allergies  Allergen Reactions  . Morphine And Related Nausea And Vomiting    Social History   Socioeconomic History  . Marital status: Divorced    Spouse name: Not on file  . Number of children: Not on file  . Years of education: Not on file  . Highest education level: Not on file  Occupational History  . Not on file  Tobacco Use  . Smoking status: Former Smoker    Quit date: 07/08/2007    Years since quitting: 12.4  . Smokeless tobacco: Never Used  Vaping Use  . Vaping Use: Never used  Substance and Sexual Activity  . Alcohol use: No  . Drug use: No  . Sexual activity: Not on file  Other Topics Concern  . Not on file  Social History Narrative  . Not on file   Social Determinants of Health   Financial Resource Strain:   . Difficulty of Paying Living Expenses:   Food Insecurity:   . Worried About Charity fundraiser in  the Last Year:   . [place] in the Last Year:   Transportation Needs:   . Film/video editor (Medical):   Marland Kitchen Lack of Transportation (Non-Medical):   Physical Activity:   . Days of Exercise per Week:   . Minutes of Exercise per Session:   Stress:   . Feeling of Stress :   Social Connections:   . Frequency of Communication with Friends and Family:   . Frequency of Social Gatherings with Friends and Family:   . Attends Religious Services:   . Active Member of Clubs or Organizations:   . Attends Archivist Meetings:   Marland Kitchen Marital Status:   Intimate Partner Violence:   . Fear of Current or Ex-Partner:   . Emotionally Abused:   Marland Kitchen Physically Abused:   . Sexually Abused:       Family History  Problem Relation Age of Onset  . Hypertension Sister   . Hyperlipidemia Sister   . Diabetes Sister   . Hemochromatosis Sister   . Colon cancer Father   . Colon cancer Paternal Uncle   . Rectal cancer Neg Hx   . Stomach cancer Neg Hx   . Esophageal cancer Neg Hx      Review of Systems Constitutional: negative for anorexia, fevers and sweats  Eyes: negative for irritation, redness and visual disturbance  Ears, nose, mouth, throat, and face: negative for earaches, epistaxis, nasal congestion and sore throat  Respiratory: negative for cough, , sputum and wheezing  Cardiovascular: negative for chest pain,  orthopnea, palpitations and syncope  Gastrointestinal: negative for abdominal pain, constipation, diarrhea, melena, nausea and vomiting  Genitourinary:negative for dysuria, frequency and hematuria  Hematologic/lymphatic: negative for bleeding, easy bruising and lymphadenopathy  Musculoskeletal:negative for arthralgias, muscle weakness and stiff joints  Neurological: negative for coordination problems, gait problems, headaches and weakness  Endocrine: negative for diabetic symptoms including polydipsia, polyuria and weight loss     Objective:   Physical Exam  Gen. Pleasant, obese, in no distress, normal affect ENT - no pallor,icterus, no post nasal drip, class 2-3 airway Neck: No JVD, no thyromegaly, no carotid bruits Lungs: no use of accessory muscles, no dullness to percussion, decreased without rales or rhonchi  Cardiovascular: Rhythm regular, heart sounds  normal, no murmurs or gallops, 1+ peripheral edema Abdomen: soft and non-tender, no hepatosplenomegaly, BS normal. Musculoskeletal: No deformities, no cyanosis or clubbing Neuro:  alert, non focal, no tremors       Assessment & Plan:

## 2020-01-04 ENCOUNTER — Telehealth: Payer: Self-pay | Admitting: Pulmonary Disease

## 2020-01-07 NOTE — Telephone Encounter (Signed)
I have called pt and scheduled her HST.  Nothing further needed.

## 2020-01-09 LAB — LAB REPORT - SCANNED
Free T4: 1.2
TSH: 2.4 (ref 0.41–5.90)

## 2020-01-15 ENCOUNTER — Ambulatory Visit: Payer: Medicare HMO

## 2020-01-15 ENCOUNTER — Other Ambulatory Visit: Payer: Self-pay

## 2020-01-15 DIAGNOSIS — R0609 Other forms of dyspnea: Secondary | ICD-10-CM

## 2020-01-15 DIAGNOSIS — G4733 Obstructive sleep apnea (adult) (pediatric): Secondary | ICD-10-CM

## 2020-01-18 ENCOUNTER — Telehealth: Payer: Self-pay | Admitting: Pulmonary Disease

## 2020-01-18 DIAGNOSIS — G4733 Obstructive sleep apnea (adult) (pediatric): Secondary | ICD-10-CM | POA: Diagnosis not present

## 2020-01-18 NOTE — Telephone Encounter (Signed)
HST showed mild OSA , AHI 11/hour She may benefit from CPAP  If she is willing to proceed, proceed with auto CPAP 5 to 15 cm mask of choice  Office visit with me/APP in 6 weeks

## 2020-01-24 NOTE — Telephone Encounter (Signed)
Wants to wait to her appt on 03/13/2020 to think about it and discuss with family if she wants to start wearing cpap will make decision at f/u

## 2020-03-10 ENCOUNTER — Inpatient Hospital Stay (HOSPITAL_COMMUNITY): Admission: RE | Admit: 2020-03-10 | Payer: Medicare HMO | Source: Ambulatory Visit

## 2020-03-13 ENCOUNTER — Ambulatory Visit: Payer: Medicare HMO | Admitting: Adult Health

## 2020-03-13 ENCOUNTER — Encounter: Payer: Self-pay | Admitting: Pharmacist

## 2020-03-21 ENCOUNTER — Inpatient Hospital Stay: Payer: Medicare HMO

## 2020-04-04 ENCOUNTER — Inpatient Hospital Stay: Payer: Medicare HMO

## 2020-04-11 ENCOUNTER — Inpatient Hospital Stay: Payer: Medicare HMO | Attending: Oncology

## 2020-04-11 ENCOUNTER — Other Ambulatory Visit: Payer: Self-pay

## 2020-04-11 NOTE — Progress Notes (Signed)
Wendy Gomez presents today for phlebotomy per MD orders. Phlebotomy procedure started at 1407 and ended at 1417 501 grams removed. Patient observed for 30 minutes after procedure without any incident. Patient tolerated procedure well. IV needle removed intact.

## 2020-04-11 NOTE — Patient Instructions (Signed)

## 2020-05-22 ENCOUNTER — Other Ambulatory Visit: Payer: Self-pay | Admitting: Cardiology

## 2020-05-22 NOTE — Telephone Encounter (Signed)
Rx refill sent to pharmacy. 

## 2020-06-02 NOTE — Progress Notes (Signed)
PT STABLE AT TIME OF DISCHARGE 

## 2020-06-04 ENCOUNTER — Ambulatory Visit: Payer: Medicare HMO | Admitting: Adult Health

## 2020-06-09 ENCOUNTER — Inpatient Hospital Stay: Payer: Medicare HMO

## 2020-06-09 ENCOUNTER — Telehealth: Payer: Self-pay | Admitting: Oncology

## 2020-06-09 NOTE — Telephone Encounter (Signed)
Patient called to reschedule to 12/30 due to being sick.

## 2020-06-12 ENCOUNTER — Inpatient Hospital Stay: Payer: Medicare HMO | Attending: Oncology

## 2020-06-12 ENCOUNTER — Other Ambulatory Visit: Payer: Self-pay

## 2020-06-12 NOTE — Patient Instructions (Signed)

## 2020-06-12 NOTE — Progress Notes (Signed)
Wendy Gomez presents today for phlebotomy per MD orders. Phlebotomy procedure started at 1525 and ended at 1535 500 grams removed. Patient observed for 30 minutes after procedure without any incident. Patient tolerated procedure well. IV needle removed intact.

## 2020-07-05 ENCOUNTER — Other Ambulatory Visit: Payer: Self-pay | Admitting: Cardiology

## 2020-07-25 ENCOUNTER — Other Ambulatory Visit: Payer: Self-pay | Admitting: Hematology and Oncology

## 2020-07-25 ENCOUNTER — Inpatient Hospital Stay: Payer: Medicare HMO | Attending: Oncology

## 2020-07-25 ENCOUNTER — Other Ambulatory Visit: Payer: Self-pay

## 2020-07-25 LAB — IRON,TIBC AND FERRITIN PANEL
%SAT: 19.2
Ferritin: 13.7
Iron: 53
TIBC: 275

## 2020-07-25 LAB — BASIC METABOLIC PANEL
BUN: 10 (ref 4–21)
CO2: 26 — AB (ref 13–22)
Chloride: 108 (ref 99–108)
Creatinine: 0.8 (ref 0.5–1.1)
Glucose: 91
Potassium: 4 (ref 3.4–5.3)
Sodium: 140 (ref 137–147)

## 2020-07-25 LAB — CBC AND DIFFERENTIAL
HCT: 41 (ref 36–46)
Hemoglobin: 13.7 (ref 12.0–16.0)
Neutrophils Absolute: 2.86
Platelets: 252 (ref 150–399)
WBC: 5.4

## 2020-07-25 LAB — COMPREHENSIVE METABOLIC PANEL
Albumin: 4.1 (ref 3.5–5.0)
Calcium: 9 (ref 8.7–10.7)

## 2020-07-25 LAB — HEPATIC FUNCTION PANEL
ALT: 23 (ref 7–35)
AST: 24 (ref 13–35)
Alkaline Phosphatase: 96 (ref 25–125)
Bilirubin, Total: 0.6

## 2020-07-25 LAB — CBC: RBC: 4.6 (ref 3.87–5.11)

## 2020-07-28 ENCOUNTER — Inpatient Hospital Stay: Payer: Medicare HMO | Admitting: Oncology

## 2020-07-28 ENCOUNTER — Other Ambulatory Visit: Payer: Self-pay | Admitting: Oncology

## 2020-07-28 ENCOUNTER — Inpatient Hospital Stay: Payer: Medicare HMO

## 2020-07-28 ENCOUNTER — Telehealth: Payer: Self-pay | Admitting: Oncology

## 2020-07-28 NOTE — Telephone Encounter (Signed)
Per 2/14 los next appt sched and given to patient 

## 2020-08-03 NOTE — Progress Notes (Signed)
[facility]  [address] [place],  [place]  [address] [phone]  Clinic Day:  07/28/2020  Referring physician: Gala Lewandowsky, MD   HISTORY OF PRESENT ILLNESS:  The patient is a 70 y.o. 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  She comes in today to reassess her iron parameters.  Since her last visit, the patient has been doing okay.  She denies having any new symptoms which concern her for complications related to her underlying hemochromatosis.   PHYSICAL EXAM:  Blood pressure (!) 141/80, pulse 69, temperature 98.2 F (36.8 C), resp. rate 16, height 5\' 7"  (1.702 m), weight 259 lb 9.6 oz (117.8 kg), SpO2 96 %. Wt Readings from Last 3 Encounters:  07/28/20 259 lb 9.6 oz (117.8 kg)  06/12/20 257 lb 4 oz (116.7 kg)  08/15/19 250 lb 6 oz (113.6 kg)   Body mass index is 40.66 kg/m. Performance status (ECOG): 0 - Asymptomatic Physical Exam Constitutional:      Appearance: Normal appearance. She is not ill-appearing.  HENT:     Mouth/Throat:     Mouth: Mucous membranes are moist.     Pharynx: Oropharynx is clear. No oropharyngeal exudate or posterior oropharyngeal erythema.  Cardiovascular:     Rate and Rhythm: Normal rate and regular rhythm.     Heart sounds: No murmur heard. No friction rub. No gallop.   Pulmonary:     Effort: Pulmonary effort is normal. No respiratory distress.     Breath sounds: Normal breath sounds. No wheezing, rhonchi or rales.  Chest:  Breasts:     Right: No axillary adenopathy or supraclavicular adenopathy.     Left: No axillary adenopathy or supraclavicular adenopathy.    Abdominal:     General: Bowel sounds are normal. There is no distension.     Palpations: Abdomen is soft. There is no mass.     Tenderness: There is no abdominal tenderness.  Musculoskeletal:        General: No swelling.     Right lower leg: No edema.     Left lower leg: No edema.  Lymphadenopathy:     Cervical: No cervical  adenopathy.     Upper Body:     Right upper body: No supraclavicular or axillary adenopathy.     Left upper body: No supraclavicular or axillary adenopathy.     Lower Body: No right inguinal adenopathy. No left inguinal adenopathy.  Skin:    General: Skin is warm.     Coloration: Skin is not jaundiced.     Findings: No lesion or rash.  Neurological:     General: No focal deficit present.     Mental Status: She is alert and oriented to person, place, and time. Mental status is at baseline.     Cranial Nerves: Cranial nerves are intact.  Psychiatric:        Mood and Affect: Mood normal.        Behavior: Behavior normal.        Thought Content: Thought content normal.     LABS:   CBC Latest Ref Rng & Units 07/25/2020 07/07/2012  WBC - 5.4 5.7  Hemoglobin 12.0 - 16.0 13.7 15.9(H)  Hematocrit 36 - 46 41 44.7  Platelets 150 - 399 252 234   CMP Latest Ref Rng & Units 07/25/2020 07/07/2012  Glucose 70 - 99 mg/dL - 99  BUN 4 - 21 10 12   Creatinine 0.5 - 1.1 0.8 0.61  Sodium 137 - 147 140 141  Potassium 3.4 - 5.3 4.0 3.7  Chloride 99 - 108 108 106  CO2 13 - 22 26(A) 22  Calcium 8.7 - 10.7 9.0 9.6  Alkaline Phos 25 - 125 96 -  AST 13 - 35 24 -  ALT 7 - 35 23 -    Ref. Range 07/25/2020 00:00  Iron Unknown 53.0  TIBC Unknown 275  %SAT Unknown 19.2  Ferritin Unknown 13.7    ASSESSMENT & PLAN:  Assessment/Plan:  A 70 y.o. 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  When evaluating her most recent iron parameters, her ferritin is below 50;  her other iron parameters are also ideal.  Based upon this, she does not need any maintenance phlebotomies.  I will see her back in 4 months to reassess her iron parameters.  The patient understands all the plans discussed today and is in agreement with them.      Briyonna Omara Macarthur Critchley, MD

## 2020-10-22 ENCOUNTER — Telehealth: Payer: Self-pay | Admitting: Oncology

## 2020-10-22 NOTE — Telephone Encounter (Signed)
Patient rescheduled 6/13 Labs to 6/21 due to conflict in schedule

## 2020-11-21 ENCOUNTER — Encounter: Payer: Self-pay | Admitting: Hematology and Oncology

## 2020-11-21 ENCOUNTER — Other Ambulatory Visit: Payer: Self-pay

## 2020-11-21 ENCOUNTER — Inpatient Hospital Stay: Payer: Medicare HMO | Attending: Oncology

## 2020-11-21 LAB — BASIC METABOLIC PANEL
BUN: 16 (ref 4–21)
CO2: 26 — AB (ref 13–22)
Chloride: 102 (ref 99–108)
Creatinine: 0.6 (ref 0.5–1.1)
Glucose: 106
Potassium: 4.5 (ref 3.4–5.3)
Sodium: 138 (ref 137–147)

## 2020-11-21 LAB — IRON AND TIBC
Iron: 79 ug/dL (ref 28–170)
Saturation Ratios: 22 % (ref 10.4–31.8)
TIBC: 367 ug/dL (ref 250–450)
UIBC: 288 ug/dL

## 2020-11-21 LAB — COMPREHENSIVE METABOLIC PANEL
Albumin: 4.7 (ref 3.5–5.0)
Calcium: 9.8 (ref 8.7–10.7)

## 2020-11-21 LAB — CBC AND DIFFERENTIAL
HCT: 43 (ref 36–46)
Hemoglobin: 14.8 (ref 12.0–16.0)
Neutrophils Absolute: 5.59
Platelets: 227 (ref 150–399)
WBC: 8.1

## 2020-11-21 LAB — FERRITIN: Ferritin: 14 ng/mL (ref 11–307)

## 2020-11-21 LAB — HEPATIC FUNCTION PANEL
ALT: 22 (ref 7–35)
AST: 28 (ref 13–35)
Alkaline Phosphatase: 91 (ref 25–125)
Bilirubin, Total: 1

## 2020-11-21 LAB — CBC: RBC: 4.91 (ref 3.87–5.11)

## 2020-11-21 NOTE — Progress Notes (Signed)
[facility]  [address] [place],  [place]  [address] [phone]  Clinic Day:  11/25/2020  Referring physician: Gala Lewandowsky, MD  This document serves as a record of services personally performed by Marice Potter, MD. It was created on their behalf by Curry,Lauren E, a trained medical scribe. The creation of this record is based on the scribe's personal observations and the provider's statements to them.  HISTORY OF PRESENT ILLNESS:  The patient is a 70 y.o. 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  She comes in today to reassess her iron parameters.  Since her last visit, the patient has been doing okay.  She denies having any new symptoms which concern her for complications related to her underlying hemochromatosis.  PHYSICAL EXAM:  Blood pressure 135/85, pulse 66, temperature 97.8 F (36.6 C), resp. rate 16, height 5\' 7"  (1.702 m), weight 247 lb 4.8 oz (112.2 kg), SpO2 97 %. Wt Readings from Last 3 Encounters:  11/25/20 247 lb 4.8 oz (112.2 kg)  07/28/20 259 lb 9.6 oz (117.8 kg)  06/12/20 257 lb 4 oz (116.7 kg)   Body mass index is 38.73 kg/m. Performance status (ECOG): 0 - Asymptomatic Physical Exam Constitutional:      Appearance: Normal appearance. She is not ill-appearing.  HENT:     Mouth/Throat:     Mouth: Mucous membranes are moist.     Pharynx: Oropharynx is clear. No oropharyngeal exudate or posterior oropharyngeal erythema.  Cardiovascular:     Rate and Rhythm: Normal rate and regular rhythm.     Heart sounds: No murmur heard.   No friction rub. No gallop.  Pulmonary:     Effort: Pulmonary effort is normal. No respiratory distress.     Breath sounds: Normal breath sounds. No wheezing, rhonchi or rales.  Chest:  Breasts:    Right: No axillary adenopathy or supraclavicular adenopathy.     Left: No axillary adenopathy or supraclavicular adenopathy.  Abdominal:     General: Bowel sounds are normal. There is no  distension.     Palpations: Abdomen is soft. There is no mass.     Tenderness: There is no abdominal tenderness.  Musculoskeletal:        General: No swelling.     Right lower leg: No edema.     Left lower leg: No edema.  Lymphadenopathy:     Cervical: No cervical adenopathy.     Upper Body:     Right upper body: No supraclavicular or axillary adenopathy.     Left upper body: No supraclavicular or axillary adenopathy.     Lower Body: No right inguinal adenopathy. No left inguinal adenopathy.  Skin:    General: Skin is warm.     Coloration: Skin is not jaundiced.     Findings: No lesion or rash.  Neurological:     General: No focal deficit present.     Mental Status: She is alert and oriented to person, place, and time. Mental status is at baseline.     Cranial Nerves: Cranial nerves are intact.  Psychiatric:        Mood and Affect: Mood normal.        Behavior: Behavior normal.        Thought Content: Thought content normal.    LABS:   CBC Latest Ref Rng & Units 11/21/2020 07/25/2020 07/07/2012  WBC - 8.1 5.4 5.7  Hemoglobin 12.0 - 16.0 14.8 13.7 15.9(H)  Hematocrit 36 - 46 43 41 44.7  Platelets  150 - 399 227 252 234   CMP Latest Ref Rng & Units 11/21/2020 07/25/2020 07/07/2012  Glucose 70 - 99 mg/dL - - 99  BUN 4 - 21 16 10 12   Creatinine 0.5 - 1.1 0.6 0.8 0.61  Sodium 137 - 147 138 140 141  Potassium 3.4 - 5.3 4.5 4.0 3.7  Chloride 99 - 108 102 108 106  CO2 13 - 22 26(A) 26(A) 22  Calcium 8.7 - 10.7 9.8 9.0 9.6  Alkaline Phos 25 - 125 91 96 -  AST 13 - 35 28 24 -  ALT 7 - 35 22 23 -      ASSESSMENT & PLAN:  Assessment/Plan:  A 70 y.o. 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  When evaluating her most recent iron parameters, her ferritin remains well below 50;  her other iron parameters are also ideal.  Based upon this, she does not need any maintenance phlebotomies.  As she is doing well, I will see her back in 6 months to reassess her iron parameters.  The patient  understands all the plans discussed today and is in agreement with them.     I, Rita Ohara, am acting as scribe for Marice Potter, MD    I have reviewed this report as typed by the medical scribe, and it is complete and accurate.  Dequincy Macarthur Critchley, MD

## 2020-11-24 ENCOUNTER — Other Ambulatory Visit: Payer: Medicare HMO

## 2020-11-24 ENCOUNTER — Ambulatory Visit: Payer: Medicare HMO | Admitting: Gastroenterology

## 2020-11-25 ENCOUNTER — Inpatient Hospital Stay: Payer: Medicare HMO | Admitting: Oncology

## 2020-11-25 ENCOUNTER — Other Ambulatory Visit: Payer: Self-pay | Admitting: Oncology

## 2020-11-25 ENCOUNTER — Telehealth: Payer: Self-pay | Admitting: Oncology

## 2020-11-25 ENCOUNTER — Other Ambulatory Visit: Payer: Self-pay

## 2020-11-25 NOTE — Telephone Encounter (Signed)
Per 6/14 los next appt scheduled and confirmed by patient

## 2020-12-30 ENCOUNTER — Ambulatory Visit: Payer: Medicare HMO | Admitting: Gastroenterology

## 2021-02-12 DIAGNOSIS — I361 Nonrheumatic tricuspid (valve) insufficiency: Secondary | ICD-10-CM | POA: Diagnosis not present

## 2021-02-25 ENCOUNTER — Other Ambulatory Visit: Payer: Self-pay | Admitting: Family Medicine

## 2021-02-25 DIAGNOSIS — I639 Cerebral infarction, unspecified: Secondary | ICD-10-CM

## 2021-05-20 NOTE — Progress Notes (Incomplete)
[name],  [place]  [address] [phone]  Clinic Day:  05/27/2021  Referring physician: Gala Lewandowsky, MD  This document serves as a record of services personally performed by Wendy Potter, MD. It was created on their behalf by Wendy Gomez, a trained medical scribe. The creation of this record is based on the scribe's personal observations and the provider's statements to them.  HISTORY OF PRESENT ILLNESS:  The patient is a 70 y.o. 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  She comes in today to reassess her iron parameters.  Since her last visit, the patient has been doing okay.  She denies having any new symptoms which concern her for complications related to her underlying hemochromatosis.  PHYSICAL EXAM:  There were no vitals taken for this visit. Wt Readings from Last 3 Encounters:  11/25/20 247 lb 4.8 oz (112.2 kg)  07/28/20 259 lb 9.6 oz (117.8 kg)  06/12/20 257 lb 4 oz (116.7 kg)   There is no height or weight on file to calculate BMI. Performance status (ECOG): 0 - Asymptomatic Physical Exam Constitutional:      Appearance: Normal appearance. She is not ill-appearing.  HENT:     Mouth/Throat:     Mouth: Mucous membranes are moist.     Pharynx: Oropharynx is clear. No oropharyngeal exudate or posterior oropharyngeal erythema.  Cardiovascular:     Rate and Rhythm: Normal rate and regular rhythm.     Heart sounds: No murmur heard.   No friction rub. No gallop.  Pulmonary:     Effort: Pulmonary effort is normal. No respiratory distress.     Breath sounds: Normal breath sounds. No wheezing, rhonchi or rales.  Abdominal:     General: Bowel sounds are normal. There is no distension.     Palpations: Abdomen is soft. There is no mass.     Tenderness: There is no abdominal tenderness.  Musculoskeletal:        General: No swelling.     Right lower leg: No edema.     Left lower leg: No edema.   Lymphadenopathy:     Cervical: No cervical adenopathy.     Upper Body:     Right upper body: No supraclavicular or axillary adenopathy.     Left upper body: No supraclavicular or axillary adenopathy.     Lower Body: No right inguinal adenopathy. No left inguinal adenopathy.  Skin:    General: Skin is warm.     Coloration: Skin is not jaundiced.     Findings: No lesion or rash.  Neurological:     General: No focal deficit present.     Mental Status: She is alert and oriented to person, place, and time. Mental status is at baseline.  Psychiatric:        Mood and Affect: Mood normal.        Behavior: Behavior normal.        Thought Content: Thought content normal.    LABS:   CBC Latest Ref Rng & Units 11/21/2020 07/25/2020 07/07/2012  WBC - 8.1 5.4 5.7  Hemoglobin 12.0 - 16.0 14.8 13.7 15.9(H)  Hematocrit 36 - 46 43 41 44.7  Platelets 150 - 399 227 252 234   CMP Latest Ref Rng & Units 11/21/2020 07/25/2020 07/07/2012  Glucose 70 - 99 mg/dL - - 99  BUN 4 - 21 16 10 12   Creatinine 0.5 - 1.1 0.6 0.8 0.61  Sodium 137 - 147 138 140 141  Potassium 3.4 - 5.3 4.5 4.0 3.7  Chloride 99 - 108 102 108 106  CO2 13 - 22 26(A) 26(A) 22  Calcium 8.7 - 10.7 9.8 9.0 9.6  Alkaline Phos 25 - 125 91 96 -  AST 13 - 35 28 24 -  ALT 7 - 35 22 23 -      ASSESSMENT & PLAN:  Assessment/Plan:  A 70 y.o. 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  When evaluating her most recent iron parameters, her ferritin remains well below 50;  her other iron parameters are also ideal.  Based upon this, she does not need any maintenance phlebotomies.  As she is doing well, I will see her back in 6 months to reassess her iron parameters.  The patient understands all the plans discussed today and is in agreement with them.     I, Wendy Gomez, am acting as scribe for Wendy Potter, MD    I have reviewed this report as typed by the medical scribe, and it is complete and accurate.  Wendy Macarthur Critchley, MD

## 2021-05-22 ENCOUNTER — Telehealth: Payer: Self-pay | Admitting: Oncology

## 2021-05-22 NOTE — Telephone Encounter (Signed)
Patient rescheduled Dec Appt's to Jan

## 2021-05-26 ENCOUNTER — Other Ambulatory Visit: Payer: Medicare HMO

## 2021-05-27 ENCOUNTER — Ambulatory Visit: Payer: Medicare HMO | Admitting: Oncology

## 2021-06-19 NOTE — Progress Notes (Signed)
[facility]  [address] [place],  [place]  [address] [phone]  Clinic Day:  06/23/2021  Referring physician: Gala Lewandowsky, MD  This document serves as a record of services personally performed by Marice Potter, MD. It was created on their behalf by Curry,Lauren E, a trained medical scribe. The creation of this record is based on the scribe's personal observations and the provider's statements to them.  HISTORY OF PRESENT ILLNESS:  The patient is a 71 y.o. 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  She comes in today to reassess her iron parameters.  Since her last visit, the patient has been doing okay.  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  Otherwise, she denies having any new symptoms which concern her for complications related to her underlying hemochromatosis.  PHYSICAL EXAM:  Blood pressure (!) 145/81, pulse 69, temperature 98.6 F (37 C), resp. rate 16, height 5\' 7"  (1.702 m), weight 251 lb 12.8 oz (114.2 kg), SpO2 97 %. Wt Readings from Last 3 Encounters:  06/23/21 251 lb 12.8 oz (114.2 kg)  11/25/20 247 lb 4.8 oz (112.2 kg)  07/28/20 259 lb 9.6 oz (117.8 kg)   Body mass index is 39.44 kg/m. Performance status (ECOG): 0 - Asymptomatic Physical Exam Constitutional:      Appearance: Normal appearance. She is not ill-appearing.  HENT:     Mouth/Throat:     Mouth: Mucous membranes are moist.     Pharynx: Oropharynx is clear. No oropharyngeal exudate or posterior oropharyngeal erythema.  Cardiovascular:     Rate and Rhythm: Normal rate and regular rhythm.     Heart sounds: No murmur heard.   No friction rub. No gallop.  Pulmonary:     Effort: Pulmonary effort is normal. No respiratory distress.     Breath sounds: Normal breath sounds. No wheezing, rhonchi or rales.  Abdominal:     General: Bowel sounds are normal. There is no distension.     Palpations: Abdomen is soft. There is no mass.      Tenderness: There is no abdominal tenderness.  Musculoskeletal:        General: No swelling.     Right lower leg: No edema.     Left lower leg: No edema.  Lymphadenopathy:     Cervical: No cervical adenopathy.     Upper Body:     Right upper body: No supraclavicular or axillary adenopathy.     Left upper body: No supraclavicular or axillary adenopathy.     Lower Body: No right inguinal adenopathy. No left inguinal adenopathy.  Skin:    General: Skin is warm.     Coloration: Skin is not jaundiced.     Findings: No lesion or rash.  Neurological:     General: No focal deficit present.     Mental Status: She is alert and oriented to person, place, and time. Mental status is at baseline.  Psychiatric:        Mood and Affect: Mood normal.        Behavior: Behavior normal.        Thought Content: Thought content normal.    LABS:   CBC Latest Ref Rng & Units 06/22/2021 11/21/2020 07/25/2020  WBC - 6.4 8.1 5.4  Hemoglobin 12.0 - 16.0 15.7 14.8 13.7  Hematocrit 36 - 46 47(A) 43 41  Platelets 150 - 399 251 227 252   CMP Latest Ref Rng & Units 06/22/2021 11/21/2020 07/25/2020  Glucose 70 -  99 mg/dL - - -  BUN 4 - 21 15 16 10   Creatinine 0.5 - 1.1 0.6 0.6 0.8  Sodium 137 - 147 140 138 140  Potassium 3.4 - 5.3 4.4 4.5 4.0  Chloride 99 - 108 105 102 108  CO2 13 - 22 26(A) 26(A) 26(A)  Calcium 8.7 - 10.7 9.7 9.8 9.0  Alkaline Phos 25 - 125 107 91 96  AST 13 - 35 30 28 24   ALT 7 - 35 25 22 23     Latest Reference Range & Units 06/22/21 09:31  Iron 28 - 170 ug/dL 156  UIBC ug/dL 183  TIBC 250 - 450 ug/dL 339  Saturation Ratios 10.4 - 31.8 % 46 (H)  Ferritin 11 - 307 ng/mL 19  (H): Data is abnormally high  ASSESSMENT & PLAN:  Assessment/Plan:  A 71 y.o. 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  When evaluating her most recent iron parameters, her ferritin remains well below 50.  However, her other iron parameters are clearly rising.  Based upon this, she will be phlebotomized next week  and in 3 months.  Otherwise, I will see her back in 6 months to reassess her iron parameters.  The patient understands all the plans discussed today and is in agreement with them.     I, Rita Ohara, am acting as scribe for Marice Potter, MD    I have reviewed this report as typed by the medical scribe, and it is complete and accurate.  Harles Evetts Macarthur Critchley, MD

## 2021-06-22 ENCOUNTER — Other Ambulatory Visit: Payer: Self-pay

## 2021-06-22 ENCOUNTER — Inpatient Hospital Stay: Payer: Medicare HMO | Attending: Oncology

## 2021-06-22 ENCOUNTER — Other Ambulatory Visit: Payer: Self-pay | Admitting: Hematology and Oncology

## 2021-06-22 LAB — BASIC METABOLIC PANEL
BUN: 15 (ref 4–21)
CO2: 26 — AB (ref 13–22)
Chloride: 105 (ref 99–108)
Creatinine: 0.6 (ref 0.5–1.1)
Glucose: 111
Potassium: 4.4 (ref 3.4–5.3)
Sodium: 140 (ref 137–147)

## 2021-06-22 LAB — CBC AND DIFFERENTIAL
HCT: 47 — AB (ref 36–46)
Hemoglobin: 15.7 (ref 12.0–16.0)
Neutrophils Absolute: 3.9
Platelets: 251 (ref 150–399)
WBC: 6.4

## 2021-06-22 LAB — HEPATIC FUNCTION PANEL
ALT: 25 (ref 7–35)
AST: 30 (ref 13–35)
Alkaline Phosphatase: 107 (ref 25–125)
Bilirubin, Total: 1.7

## 2021-06-22 LAB — IRON AND TIBC
Iron: 156 ug/dL (ref 28–170)
Saturation Ratios: 46 % — ABNORMAL HIGH (ref 10.4–31.8)
TIBC: 339 ug/dL (ref 250–450)
UIBC: 183 ug/dL

## 2021-06-22 LAB — CBC
MCV: 91 (ref 81–99)
RBC: 5.2 — AB (ref 3.87–5.11)

## 2021-06-22 LAB — COMPREHENSIVE METABOLIC PANEL
Albumin: 4.6 (ref 3.5–5.0)
Calcium: 9.7 (ref 8.7–10.7)

## 2021-06-22 LAB — FERRITIN: Ferritin: 19 ng/mL (ref 11–307)

## 2021-06-23 ENCOUNTER — Other Ambulatory Visit: Payer: Self-pay | Admitting: Oncology

## 2021-06-23 ENCOUNTER — Inpatient Hospital Stay (INDEPENDENT_AMBULATORY_CARE_PROVIDER_SITE_OTHER): Payer: Medicare HMO | Admitting: Oncology

## 2021-06-23 ENCOUNTER — Inpatient Hospital Stay: Payer: Medicare HMO

## 2021-07-02 ENCOUNTER — Inpatient Hospital Stay: Payer: Medicare HMO

## 2021-07-04 IMAGING — CT CT ABDOMEN AND PELVIS WITH CONTRAST
2 of 5 series · 16 of 46 positions shown, 18 images · IV contrast (omnipaque)
Comparison: 09/13/2012

CLINICAL DATA: Right lower quadrant and right flank pain for
several months. Diverticulosis. Endometriosis. Previous
cholecystectomy, appendectomy, and hysterectomy.

EXAM:
CT ABDOMEN AND PELVIS WITH CONTRAST
TECHNIQUE: Multidetector CT imaging of the abdomen and pelvis was performed
using the standard protocol following bolus administration of
intravenous contrast.
CONTRAST:  100mL OMNIPAQUE IOHEXOL 300 MG/ML  SOLN

[Series 2: axial st · axial · 0.98mm/px · z∈[-528,-78]mm · 13 of 102 slices shown, 15 images]
[im 6/102  soft-tissue]
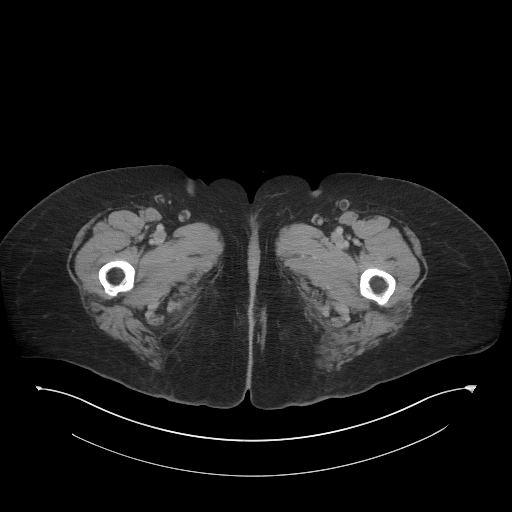
[im 6/102  bone]
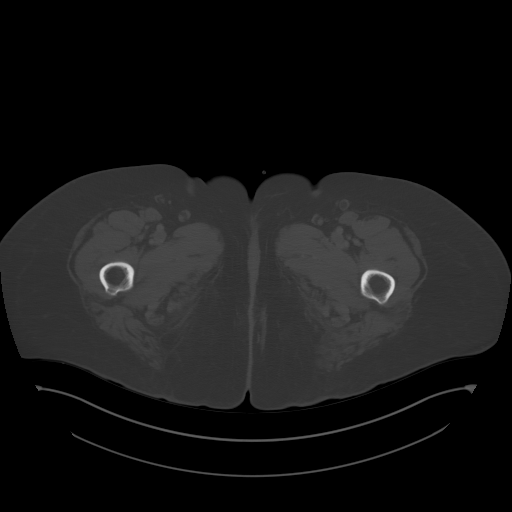
[im 12/102  soft-tissue]
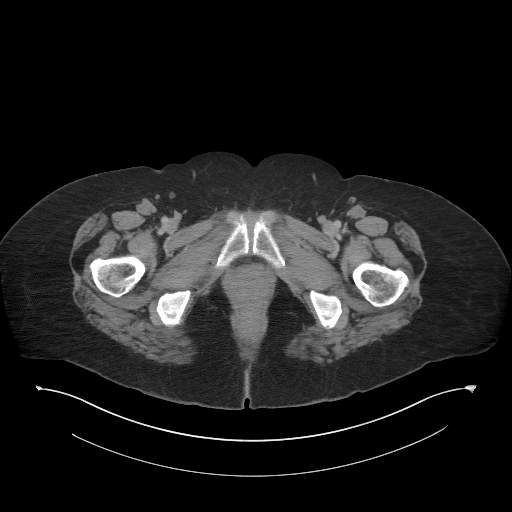
[im 23/102  soft-tissue]
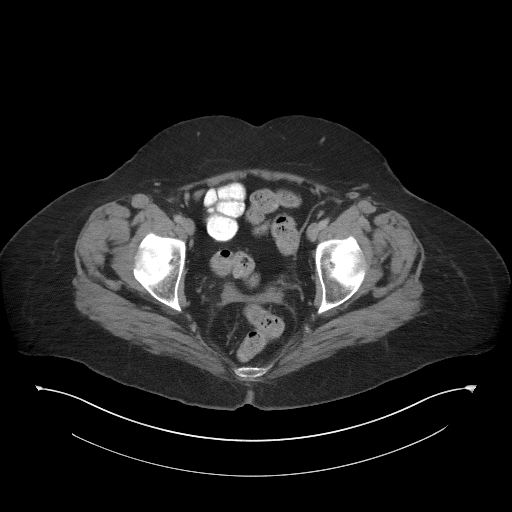
[im 29/102  soft-tissue]
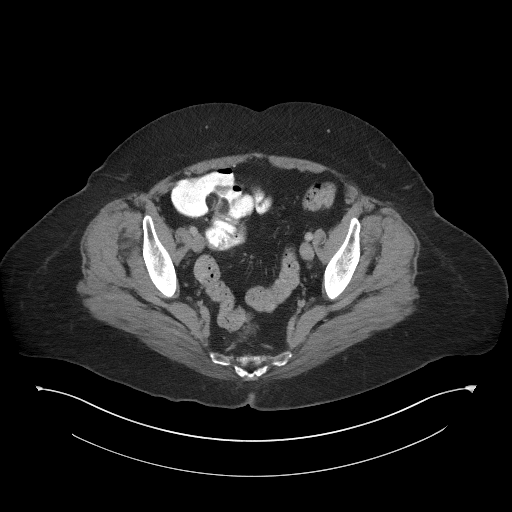
[im 34/102  soft-tissue]
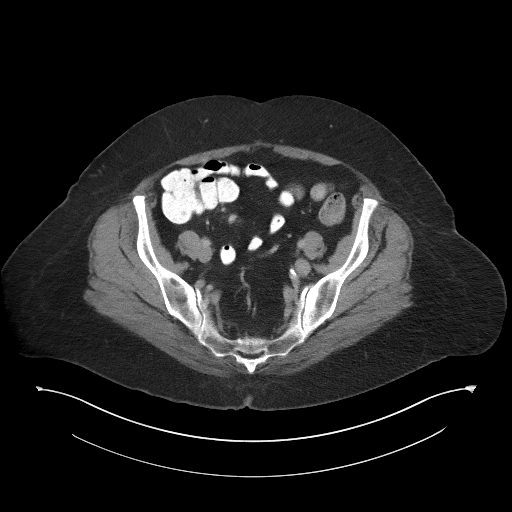
[im 45/102  soft-tissue]
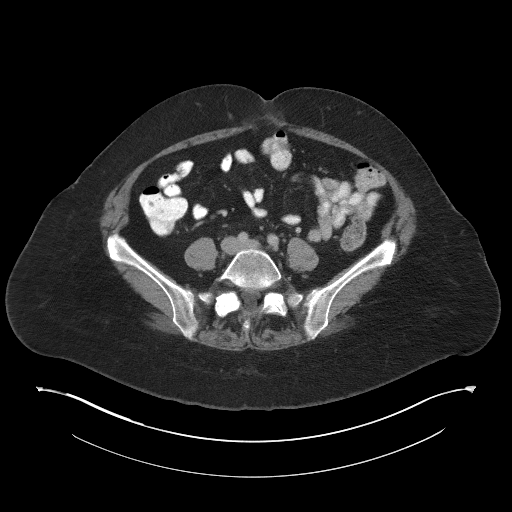
[im 51/102  soft-tissue]
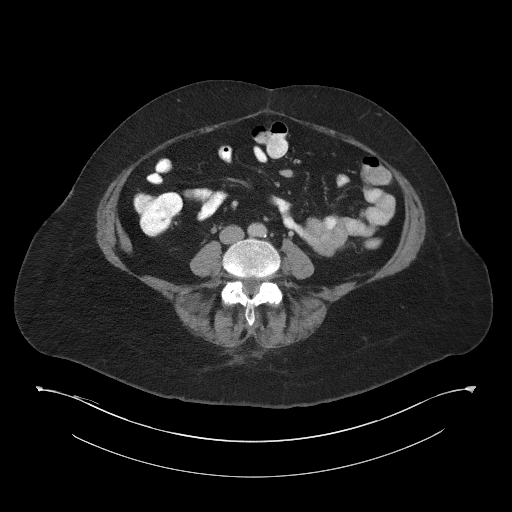
[im 57/102  soft-tissue]
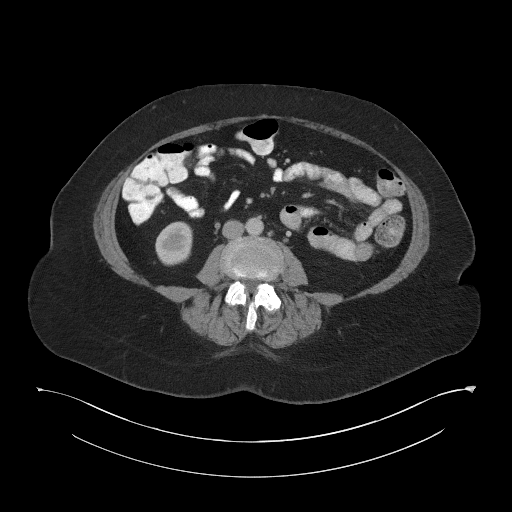
[im 68/102  soft-tissue]
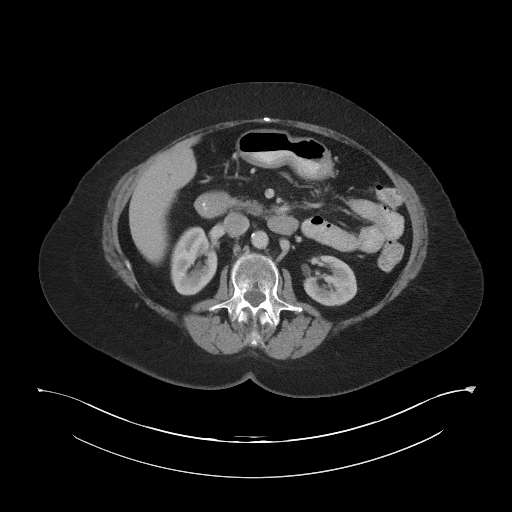
[im 68/102  bone]
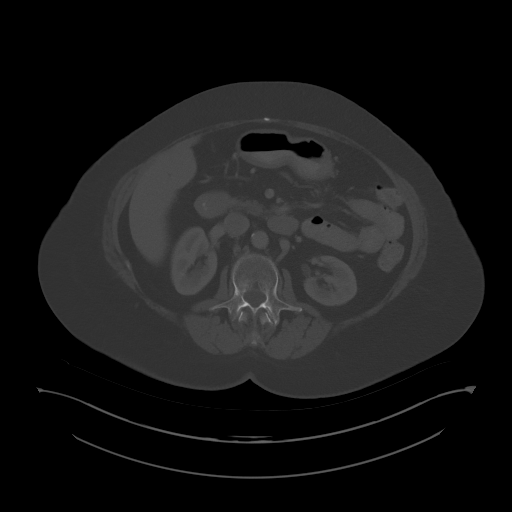
[im 73/102  soft-tissue]
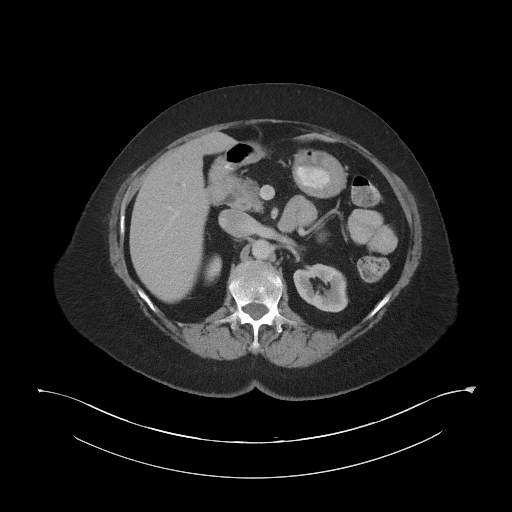
[im 79/102  soft-tissue]
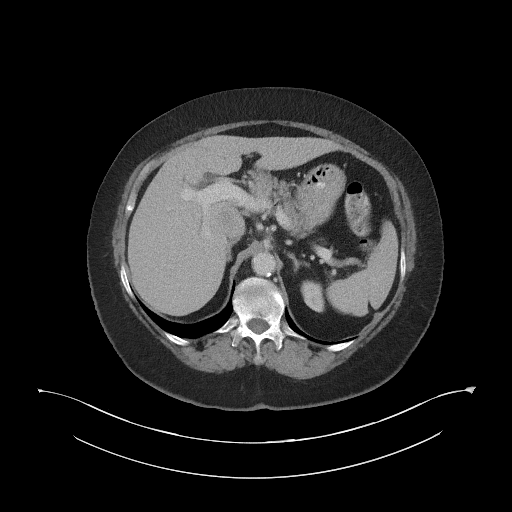
[im 90/102  soft-tissue]
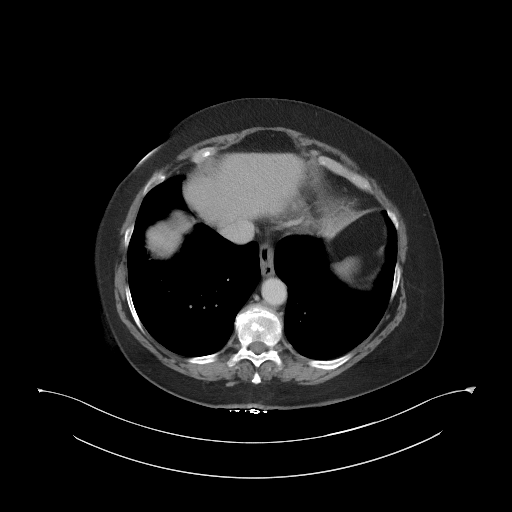
[im 96/102  soft-tissue]
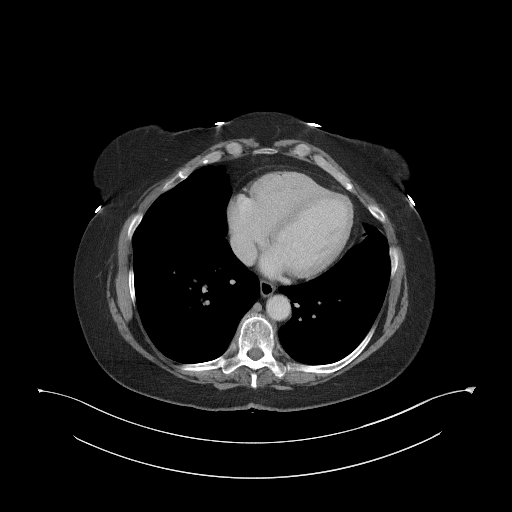

[Series 5: coronal st · coronal · 0.84mm/px · 3 of 109 slices shown]
[im 37/109  soft-tissue]
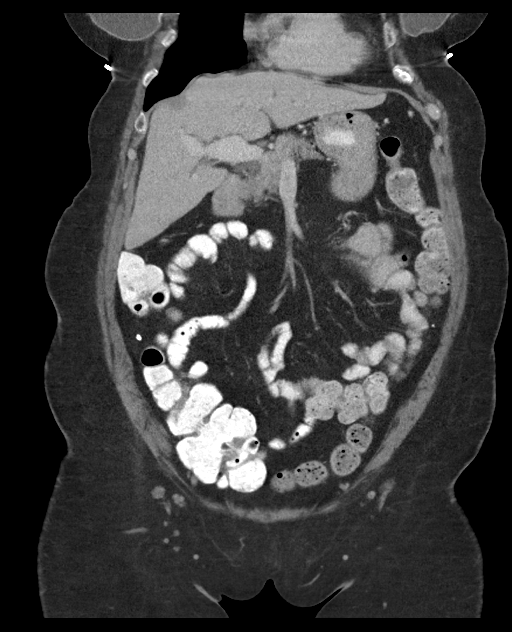
[im 49/109  soft-tissue]
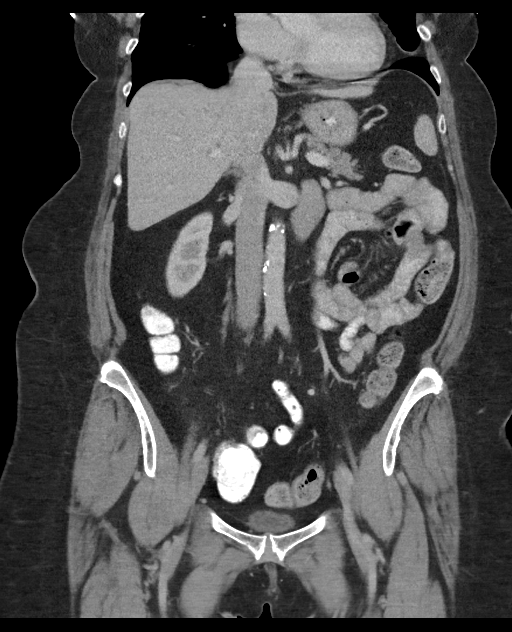
[im 61/109  soft-tissue]
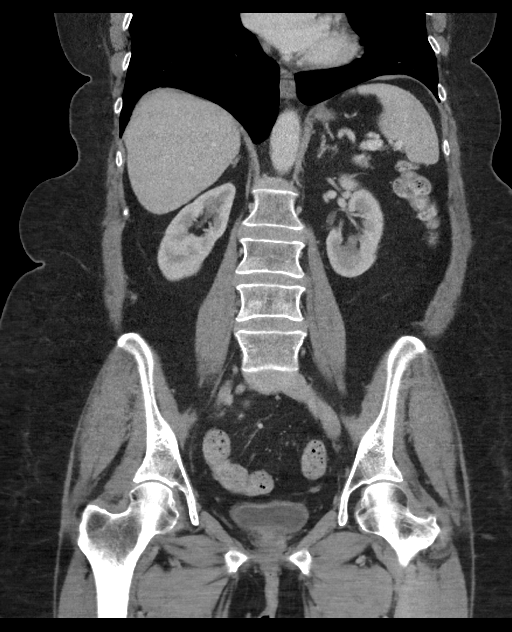

[16 of 46 positions shown; findings below may reference images not displayed]

FINDINGS: Lower Chest: No acute findings.

Hepatobiliary: No hepatic masses identified. Prior cholecystectomy.
No evidence of biliary obstruction. Stable mild diffuse biliary
ductal dilatation.

Pancreas:  No mass or inflammatory changes.

Spleen: Within normal limits in size and appearance.

Adrenals/Urinary Tract: No masses identified. Tiny left renal cyst
noted. No evidence of ureteral calculi or hydronephrosis.

Stomach/Bowel: No evidence of obstruction, inflammatory process or
abnormal fluid collections.

Vascular/Lymphatic: No pathologically enlarged lymph nodes. No
abdominal aortic aneurysm. Aortic atherosclerosis.

Reproductive: Prior hysterectomy noted. Adnexal regions are
unremarkable in appearance.

Other: A small paraumbilical ventral hernia is seen containing only
fat, which is new since 6776 exam.

Musculoskeletal:  No suspicious bone lesions identified.
IMPRESSION: 1. No acute findings within the abdomen or pelvis.
2. Small paraumbilical ventral hernia containing only fat.

Aortic Atherosclerosis ([id number]).

## 2021-08-24 ENCOUNTER — Inpatient Hospital Stay: Payer: Medicare HMO | Attending: Oncology

## 2021-08-24 ENCOUNTER — Other Ambulatory Visit: Payer: Self-pay

## 2021-08-24 NOTE — Progress Notes (Signed)
Caleb Popp Wall Newbrough presents today for phlebotomy per MD orders. ?Phlebotomy procedure started at 1305 and ended at 1315. ?500 grams removed. ?Patient observed for 30 minutes after procedure without any incident. ?Patient tolerated procedure well. ?IV needle removed intact. ? ? ?

## 2021-08-24 NOTE — Patient Instructions (Signed)

## 2021-09-29 ENCOUNTER — Inpatient Hospital Stay: Payer: Medicare HMO | Attending: Oncology

## 2021-09-29 NOTE — Progress Notes (Signed)
Wendy Gomez presents today for phlebotomy per MD orders. ?Phlebotomy procedure started at 1405 and ended at 1410. ?500 grams removed. ?Patient deferred 30 minutes watch time.  ?Patient tolerated procedure well. ?IV needle removed intact. ? ? ?

## 2021-09-29 NOTE — Patient Instructions (Signed)

## 2021-12-18 ENCOUNTER — Inpatient Hospital Stay: Payer: Medicare HMO | Attending: Oncology

## 2021-12-18 LAB — CBC AND DIFFERENTIAL
HCT: 40 (ref 36–46)
Hemoglobin: 13.2 (ref 12.0–16.0)
Neutrophils Absolute: 5.21
Platelets: 264 10*3/uL (ref 150–400)
WBC: 8.4

## 2021-12-18 LAB — COMPREHENSIVE METABOLIC PANEL
Albumin: 4.2 (ref 3.5–5.0)
Calcium: 9.6 (ref 8.7–10.7)

## 2021-12-18 LAB — IRON AND TIBC
Iron: 45 ug/dL (ref 28–170)
Saturation Ratios: 13 % (ref 10.4–31.8)
TIBC: 350 ug/dL (ref 250–450)
UIBC: 305 ug/dL

## 2021-12-18 LAB — BASIC METABOLIC PANEL
BUN: 15 (ref 4–21)
CO2: 25 — AB (ref 13–22)
Chloride: 106 (ref 99–108)
Creatinine: 0.7 (ref 0.5–1.1)
Glucose: 100
Potassium: 4.4 mEq/L (ref 3.5–5.1)
Sodium: 138 (ref 137–147)

## 2021-12-18 LAB — FERRITIN: Ferritin: 6 ng/mL — ABNORMAL LOW (ref 11–307)

## 2021-12-18 LAB — HEPATIC FUNCTION PANEL
ALT: 21 U/L (ref 7–35)
AST: 30 (ref 13–35)
Alkaline Phosphatase: 103 (ref 25–125)
Bilirubin, Total: 0.9

## 2021-12-18 LAB — CBC: RBC: 4.75 (ref 3.87–5.11)

## 2021-12-20 NOTE — Progress Notes (Signed)
  [facility]  [address] [place],  [place]  [address] [phone]  Clinic Day:  12/21/2021  Referring physician: Gala Lewandowsky, MD  TELEPHONE APPOINTMENT   HISTORY OF PRESENT ILLNESS:  The patient is a 71 y.o. 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  This telephone visit's primary purpose is to go over her recent iron results with her.  Of note, she was phlebotomized once every 3 months to get her iron parameters back to more ideal levels.  Since her last visit, the patient has been doing okay.  She denies having any new symptoms which concern her for complications related to her underlying hemochromatosis.  PHYSICAL EXAM: DEFERRED   LABS:      Latest Ref Rng & Units 12/18/2021   12:00 AM 06/22/2021   12:00 AM 11/21/2020   12:00 AM  CBC  WBC  8.4     6.4     8.1   Hemoglobin 12.0 - 16.0 13.2     15.7     14.8   Hematocrit 36 - 46 40     47     43   Platelets 150 - 400 K/uL 264     251     227      This result is from an external source.      Latest Ref Rng & Units 12/18/2021   12:00 AM 06/22/2021   12:00 AM 11/21/2020   12:00 AM  CMP  BUN 4 - '21 15     15     16   '$ Creatinine 0.5 - 1.1 0.7     0.6     0.6   Sodium 137 - 147 138     140     138   Potassium 3.5 - 5.1 mEq/L 4.4     4.4     4.5   Chloride 99 - 108 106     105     102   CO2 13 - '22 25     26     26   '$ Calcium 8.7 - 10.7 9.6     9.7     9.8   Alkaline Phos 25 - 125 103     107     91   AST 13 - 35 '30     30     28   '$ ALT 7 - 35 U/L '21     25     22      '$ This result is from an external source.    Latest Reference Range & Units 12/18/21 10:37 12/18/21 10:57  Iron 28 - 170 ug/dL  45  UIBC ug/dL  305  TIBC 250 - 450 ug/dL  350  Saturation Ratios 10.4 - 31.8 %  13  Ferritin 11 - 307 ng/mL 6 (L)   (L): Data is abnormally low  ASSESSMENT & PLAN:  Assessment/Plan:  A 71 y.o. 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  When evaluating her most recent iron parameters, her  ferritin is well below 50.  Furthermore, her other iron parameters have fallen back to normal levels after her recent phlebotomies.  Based upon this, she will need to be phlebotomized over these next months.  I will see her back in 6 months to reassess her iron parameters.  The patient understands all the plans discussed today and is in agreement with them.    Wendy Duffner Macarthur Critchley, MD

## 2021-12-21 ENCOUNTER — Inpatient Hospital Stay: Payer: Medicare HMO | Admitting: Oncology

## 2022-02-08 ENCOUNTER — Encounter: Payer: Self-pay | Admitting: Oncology

## 2022-02-10 ENCOUNTER — Ambulatory Visit: Payer: Medicare HMO | Admitting: Podiatrist

## 2022-02-10 ENCOUNTER — Encounter: Payer: Self-pay | Admitting: Podiatrist

## 2022-02-10 ENCOUNTER — Ambulatory Visit (INDEPENDENT_AMBULATORY_CARE_PROVIDER_SITE_OTHER): Payer: Medicare HMO

## 2022-02-10 DIAGNOSIS — M7731 Calcaneal spur, right foot: Secondary | ICD-10-CM | POA: Diagnosis not present

## 2022-02-10 DIAGNOSIS — L988 Other specified disorders of the skin and subcutaneous tissue: Secondary | ICD-10-CM | POA: Diagnosis not present

## 2022-02-10 DIAGNOSIS — M722 Plantar fascial fibromatosis: Secondary | ICD-10-CM

## 2022-02-10 MED ORDER — CLOTRIMAZOLE-BETAMETHASONE 1-0.05 % EX CREA: 1.0000 | TOPICAL_CREAM | Freq: Every day | CUTANEOUS | 3 refills | Status: AC

## 2022-02-10 MED ORDER — TRIAMCINOLONE ACETONIDE 10 MG/ML IJ SUSP
10.0000 mg | Freq: Once | INTRAMUSCULAR | Status: AC
  Administered 2022-02-10: 10 mg

## 2022-02-10 NOTE — Patient Instructions (Signed)

## 2022-02-10 NOTE — Progress Notes (Signed)
Subjective: Wendy Gomez is a 71 y.o. female patient presents to office with complaint of moderate heel pain on the right  heel. Patient admits to post static dyskinesia for several weeks in duration. Patient has treated this problem with using a walker and stretches with no relief. She also relates she has developed crusty skin on the bottoms of both of her heels. She states the skin of her feet was normal until she got a pedicure a while ago- she also has a patch of similar crusty skin on her right hand palmar surface.    Patient Active Problem List   Diagnosis Date Noted   Hemochromatosis 03/13/2020   DOE (dyspnea on exertion) 12/12/2019   Obstructive sleep apnea 11/01/2018   Palpitations 10/31/2018   Essential hypertension 10/31/2018   Atypical chest pain 10/31/2018   Generalized anxiety disorder 10/31/2018    Current Outpatient Medications on File Prior to Visit  Medication Sig Dispense Refill   albuterol (PROVENTIL HFA;VENTOLIN HFA) 108 (90 BASE) MCG/ACT inhaler Inhale 2 puffs into the lungs every 6 (six) hours as needed.     aspirin EC 81 MG tablet Take 81 mg by mouth daily.     cholecalciferol (VITAMIN D3) 25 MCG (1000 UT) tablet Take 2,000 Units by mouth daily.     dicyclomine (BENTYL) 10 MG capsule TAKE 1 CAPSULE BY MOUTH TWICE DAILY 1/2 HOUR BEFORE MEALS AND AT BEDTIME. 360 capsule 1   diltiazem (CARDIZEM CD) 120 MG 24 hr capsule TAKE 1 CAPSULE BY MOUTH EVERY DAY 30 capsule 0   fluticasone (FLONASE) 50 MCG/ACT nasal spray Place 2 sprays into both nostrils as needed.     metoprolol tartrate (LOPRESSOR) 25 MG tablet Take 0.5 tablets (12.5 mg total) by mouth 2 (two) times daily. 60 tablet 1   Omega-3 Fatty Acids (FISH OIL) 1000 MG CAPS Take 2 capsules by mouth daily.     omeprazole (PRILOSEC) 20 MG capsule Take 20 mg by mouth daily.     Polyethylene Glycol 3350 (MIRALAX PO) Take by mouth daily.     No current facility-administered medications on file prior to visit.     Allergies  Allergen Reactions   Morphine And Related Nausea And Vomiting    Objective: Physical Exam General: The patient is alert and oriented x3 in no acute distress.  Dermatology: Skin is warm, dry and supple bilateral lower extremities. Nails 1-10 are asymptomatic. There is no erythema, edema, no eccymosis, no open lesions present. Tan crust like plaques lesions located on the plantar surface of bilateral heels with the left being worse than the right  Vascular: Dorsalis Pedis pulse and Posterior Tibial pulse are 2/4 bilateral. Capillary fill time is immediate to all digits.  Neurological: Grossly intact to light touch with an achilles reflex of +2/5 and a  negative Tinel's sign bilateral.  Musculoskeletal: Tenderness to palpation at the medial calcaneal tubercale and through the insertion of the plantar fascia on the Right foot. No pain with compression of calcaneus bilateral. No pain with tuning fork to calcaneus bilateral. No pain with calf compression bilateral. There is decreased Ankle joint range of motion bilateral. All other joints range of motion within normal limits bilateral. Strength 5/5 in all groups bilateral.   Xray, rightfoot:  Normal osseous mineralization. Joint spaces preserved. No fracture/dislocation/boney destruction. Very small calcaneal spur present with mild thickening of plantar fascia. No other soft tissue abnormalities or radiopaque foreign bodies.   Assessment and Plan:   ICD-10-CM   1. Plantar fasciitis,  right  M72.2 DG Foot Complete Right    2. Skin plaque  L98.8         -Complete examination performed.  -Xrays reviewed - Discussed etiology and pathology of plantar fasciitis along with conservative approach to treatment.   -After oral consent and aseptic prep, injected a mixture containing '10mg'$  Kenalog and '5mg'$  marcaine plain was infiltrated into the area of maximal tenderness of the Right heel. Post-injection care discussed with patient.   -Rx Lotrisone cream to try on the skin of the heels for the next 4 weeks.  -Recommended good supportive shoes  -Explained and dispensed to patient daily stretching exercises.. -Patient to return to office in 4 weeks for recheck of heel pain and skin condition to heels.

## 2022-03-10 ENCOUNTER — Ambulatory Visit: Payer: Medicare HMO | Admitting: Podiatry

## 2022-03-22 ENCOUNTER — Ambulatory Visit: Payer: Medicare HMO | Admitting: Podiatry

## 2022-05-24 ENCOUNTER — Ambulatory Visit: Payer: Medicare HMO | Admitting: Cardiology

## 2022-06-18 ENCOUNTER — Ambulatory Visit: Payer: Medicare HMO | Attending: Cardiology

## 2022-06-18 ENCOUNTER — Encounter: Payer: Self-pay | Admitting: Cardiology

## 2022-06-18 ENCOUNTER — Ambulatory Visit: Payer: Medicare HMO | Attending: Cardiology | Admitting: Cardiology

## 2022-06-18 VITALS — BP 120/82 | HR 62 | Ht 67.0 in | Wt 260.0 lb

## 2022-06-18 DIAGNOSIS — R079 Chest pain, unspecified: Secondary | ICD-10-CM

## 2022-06-18 DIAGNOSIS — R0789 Other chest pain: Secondary | ICD-10-CM | POA: Diagnosis not present

## 2022-06-18 DIAGNOSIS — R002 Palpitations: Secondary | ICD-10-CM

## 2022-06-18 DIAGNOSIS — I10 Essential (primary) hypertension: Secondary | ICD-10-CM

## 2022-06-18 DIAGNOSIS — I6930 Unspecified sequelae of cerebral infarction: Secondary | ICD-10-CM

## 2022-06-18 DIAGNOSIS — R0609 Other forms of dyspnea: Secondary | ICD-10-CM

## 2022-06-18 NOTE — Progress Notes (Unsigned)
Cardiology Consultation:    Date:  06/18/2022   ID:  Wendy Gomez, [place] 03-23-1951, MRN [id number]  PCP:  Gala Lewandowsky, MD  Cardiologist:  Jenne Campus, MD   Referring MD: Gala Lewandowsky, MD   Chief Complaint  Patient presents with   Chest Pain   Palpitations    Ongoing 06/13/22    History of Present Illness:    Wendy Gomez is a 72 y.o. female who is being seen today for the evaluation of palpitation shortness of breath fatigue and chest pain at the request of Sistasis, Rowena, MD. past medical history significant for hemochromatosis that being followed by hematology service, essential hypertension, dyslipidemia, late effect of CVA.  Apparently she did suffer from stroke more than a year ago.  I did see her initially in 2020 she was referred to me because of palpitations.  She wore monitor x 2 did not have any atrial fibrillation did not have any arrhythmia that we will explain her palpitations.  Workup at that time included also echocardiogram which was fine.  She was ordered to have carotic ultrasound but it never happened in 2020 she end up coming to the hospital with vertigo, CT of her head showed frontal lobe CVA not acute, workup at that time included echocardiogram which is fine also carotic ultrasound.  She is being anticoagulated however I do not see diagnosis of atrial fibrillation.  She was referred back to [place] because of episodes of palpitations that happened just about every week.  She also complained of weakness fatigue as well as dizziness.  On top of that there is some chest pain.  She said chest pain she had 1 time when she was taking shower suddenly she started having stabbing-like sensation in the chest lasted for few seconds and then went away.  She does not smoke she is not on any special diet she does not exercise on the regular basis  Past Medical History:  Diagnosis Date   Anxiety    Connective tissue disease ([place])    COPD (chronic  obstructive pulmonary disease) (HCC)    Depression    Diverticulosis    hx of    Endometriosis    GERD (gastroesophageal reflux disease)    Hemochromatosis    Hemochromatosis    History of Helicobacter pylori infection    Irritable bowel syndrome    Thoracic stomach hernia     Past Surgical History:  Procedure Laterality Date   ABDOMINAL HYSTERECTOMY     APPENDECTOMY     BREAST ENHANCEMENT SURGERY     CHOLECYSTECTOMY     CHOLECYSTECTOMY     COLONOSCOPY  03/19/2014   Mild sigmoid diverticulosis. External and internal hemorrhoids. Otherwise normal colonoscopy to TI.    ESOPHAGOGASTRODUODENOSCOPY  03/29/2014   Small hiatal hernia. Minimal gastritis.    LAPAROTOMY      Current Medications: Current Meds  Medication Sig   albuterol (PROVENTIL HFA;VENTOLIN HFA) 108 (90 BASE) MCG/ACT inhaler Inhale 2 puffs into the lungs every 6 (six) hours as needed for wheezing or shortness of breath.   atorvastatin (LIPITOR) 40 MG tablet Take 40 mg by mouth at bedtime.   cholecalciferol (VITAMIN D3) 25 MCG (1000 UT) tablet Take 2,000 Units by mouth daily.   clotrimazole-betamethasone (LOTRISONE) cream Apply 1 Application topically daily. Apply to bottoms of heels daily   diltiazem (CARDIZEM CD) 120 MG 24 hr capsule TAKE 1 CAPSULE BY MOUTH EVERY DAY (Patient taking differently: Take 120 mg by mouth daily.)  ELIQUIS 5 MG TABS tablet Take 5 mg by mouth 2 (two) times daily.   fluticasone (FLONASE) 50 MCG/ACT nasal spray Place 2 sprays into both nostrils as needed for allergies.   meclizine (ANTIVERT) 12.5 MG tablet Take 12.5 mg by mouth 3 (three) times daily as needed for dizziness.   metoprolol tartrate (LOPRESSOR) 25 MG tablet Take 0.5 tablets (12.5 mg total) by mouth 2 (two) times daily.   Omega-3 Fatty Acids (FISH OIL) 1000 MG CAPS Take 2 capsules by mouth daily.   omeprazole (PRILOSEC) 20 MG capsule Take 20 mg by mouth daily.   ondansetron (ZOFRAN) 4 MG tablet Take 8 mg by mouth every 6 (six)  hours as needed for nausea or vomiting.   Polyethylene Glycol 3350 (MIRALAX PO) Take 1 tablet by mouth daily.   [DISCONTINUED] aspirin EC 81 MG tablet Take 81 mg by mouth daily.   [DISCONTINUED] dicyclomine (BENTYL) 10 MG capsule TAKE 1 CAPSULE BY MOUTH TWICE DAILY 1/2 HOUR BEFORE MEALS AND AT BEDTIME. (Patient taking differently: Take 10 mg by mouth See admin instructions. TAKE 1 CAPSULE BY MOUTH TWICE DAILY 1/2 HOUR BEFORE MEALS AND AT BEDTIME.)     Allergies:   Atorvastatin and Morphine and related   Social History   Socioeconomic History   Marital status: Divorced    Spouse name: Not on file   Number of children: Not on file   Years of education: Not on file   Highest education level: Not on file  Occupational History   Not on file  Tobacco Use   Smoking status: Former    Types: Cigarettes    Quit date: 07/08/2007    Years since quitting: 14.9   Smokeless tobacco: Never  Vaping Use   Vaping Use: Never used  Substance and Sexual Activity   Alcohol use: No   Drug use: No   Sexual activity: Not on file  Other Topics Concern   Not on file  Social History Narrative   Not on file   Social Determinants of Health   Financial Resource Strain: Not on file  Food Insecurity: Not on file  Transportation Needs: Not on file  Physical Activity: Not on file  Stress: Not on file  Social Connections: Not on file     Family History: The patient's family history includes Colon cancer in her father and paternal uncle; Diabetes in her sister; Hemochromatosis in her sister; Hyperlipidemia in her sister; Hypertension in her sister. There is no history of Rectal cancer, Stomach cancer, or Esophageal cancer. ROS:   Please see the history of present illness.    All 14 point review of systems negative except as described per history of present illness.  EKGs/Labs/Other Studies Reviewed:    The following studies were reviewed today: I did review record from the hospital from 2022 including  echocardiogram  EKG:  EKG is  ordered today.  The ekg ordered today demonstrates normal sinus rhythm normal P interval left axis deviation cannot rule out septal infarct.  Recent Labs: 12/18/2021: ALT 21; BUN 15; Creatinine 0.7; Hemoglobin 13.2; Platelets 264; Potassium 4.4; Sodium 138  Recent Lipid Panel No results found for: "CHOL", "TRIG", "HDL", "CHOLHDL", "VLDL", "LDLCALC", "LDLDIRECT"  Physical Exam:    VS:  BP 120/82 (BP Location: Left Arm, Patient Position: Sitting)   Pulse 62   Ht '5\' 7"'$  (1.702 m)   Wt 260 lb (117.9 kg)   SpO2 98%   BMI 40.72 kg/m     Wt Readings from Last 3  Encounters:  06/18/22 260 lb (117.9 kg)  09/29/21 254 lb (115.2 kg)  08/24/21 255 lb 12 oz (116 kg)     GEN:  Well nourished, well developed in no acute distress HEENT: Normal NECK: No JVD; No carotid bruits LYMPHATICS: No lymphadenopathy CARDIAC: RRR, no murmurs, no rubs, no gallops RESPIRATORY:  Clear to auscultation without rales, wheezing or rhonchi  ABDOMEN: Soft, non-tender, non-distended MUSCULOSKELETAL:  No edema; No deformity  SKIN: Warm and dry NEUROLOGIC:  Alert and oriented x 3 PSYCHIATRIC:  Normal affect   ASSESSMENT:    1. Chest pain, unspecified type   2. Palpitations   3. Hereditary hemochromatosis ([place])   4. Atypical chest pain   5. Essential hypertension   6. Late effect of cerebrovascular accident (CVA)    PLAN:    In order of problems listed above:  Palpitations: Concern obviously is for atrial fibrillation.  She is anticoagulated however I reviewed her record carefully I do not see clear-cut indication for it.  Of course if she truly have atrial fibrillation that would be definitely indication for anticoagulation but so far I do not have proof for this diagnosis.  Therefore, I will ask her to wear Zio patch for 2 weeks.  If that will be unrevealing we may even consider an implantable loop recorder. Dyspnea on exertion.  I will ask her to have echocardiogram to assess  left ventricle ejection fraction.  That also will help me determine possibility of atrial fibrillation with look at the size of the atrium. Peripheral vascular disease.  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 Essential hypertension, blood pressure seems to be well-controlled continue present management. Dyslipidemia she takes Lipitor 40 however I do not have latest fasting lipid profile.  Last fasting lipid profile have is from summer 2023 with LDL 63 HDL 55.  Will call primary care physician to see if there is any more updated cholesterol   Medication Adjustments/Labs and Tests Ordered: Current medicines are reviewed at length with the patient today.  Concerns regarding medicines are outlined above.  Orders Placed This Encounter  Procedures   EKG 12-Lead   No orders of the defined types were placed in this encounter.   Signed, Park Liter, MD, [facility] 06/18/2022 9:15 AM    [place]

## 2022-06-18 NOTE — Patient Instructions (Signed)
Medication Instructions:  Your physician recommends that you continue on your current medications as directed. Please refer to the Current Medication list given to you today.  *If you need a refill on your cardiac medications before your next appointment, please call your pharmacy*   Lab Work: None Ordered If you have labs (blood work) drawn today and your tests are completely normal, you will receive your results only by: [place] (if you have MyChart) OR A paper copy in the mail If you have any lab test that is abnormal or we need to change your treatment, we will call you to review the results.   Testing/Procedures: Your physician has requested that you have an echocardiogram. Echocardiography is a painless test that uses sound waves to create images of your heart. It provides your doctor with information about the size and shape of your heart and how well your heart's chambers and valves are working. This procedure takes approximately one hour. There are no restrictions for this procedure. Please do NOT wear cologne, perfume, aftershave, or lotions (deodorant is allowed). Please arrive 15 minutes prior to your appointment time.    WHY IS MY DOCTOR PRESCRIBING ZIO? The Zio system is proven and trusted by physicians to detect and diagnose irregular heart rhythms -- and has been prescribed to hundreds of thousands of patients.  The FDA has cleared the Zio system to monitor for many different kinds of irregular heart rhythms. In a study, physicians were able to reach a diagnosis 90% of the time with the Zio system1.  You can wear the Zio monitor -- a small, discreet, comfortable patch -- during your normal day-to-day activity, including while you sleep, shower, and exercise, while it records every single heartbeat for analysis.  1Barrett, P., et al. Comparison of 24 Hour Holter Monitoring Versus 14 Day Novel Adhesive Patch Electrocardiographic Monitoring. [place], 2014.  ZIO VS. HOLTER MONITORING The Zio monitor can be comfortably worn for up to 14 days. Holter monitors can be worn for 24 to 48 hours, limiting the time to record any irregular heart rhythms you may have. Zio is able to capture data for the 51% of patients who have their first symptom-triggered arrhythmia after 48 hours.1  LIVE WITHOUT RESTRICTIONS The Zio ambulatory cardiac monitor is a small, unobtrusive, and water-resistant patch--you might even forget you're wearing it. The Zio monitor records and stores every beat of your heart, whether you're sleeping, working out, or showering.    Your physician has requested that you have a carotid duplex. This test is an ultrasound of the carotid arteries in your neck. It looks at blood flow through these arteries that supply the brain with blood. Allow one hour for this exam. There are no restrictions or special instructions.   Follow-Up: At [facility], you and your health needs are our priority.  As part of our continuing mission to provide you with exceptional heart care, we have created designated Provider Care Teams.  These Care Teams include your primary Cardiologist (physician) and Advanced Practice Providers (APPs -  Physician Assistants and Nurse Practitioners) who all work together to provide you with the care you need, when you need it.  We recommend signing up for the patient portal called "MyChart".  Sign up information is provided on this After Visit Summary.  MyChart is used to connect with patients for Virtual Visits (Telemedicine).  Patients are able to view lab/test results, encounter notes, upcoming appointments, etc.  Non-urgent messages can be sent  to your provider as well.   To learn more about what you can do with MyChart, go to [email or web address].    Your next appointment:   2 month(s)  The format for your next appointment:   In Person  Provider:   Jenne Campus, MD    Other Instructions NA

## 2022-06-19 IMAGING — DX DG CHEST 2V
2 series · 2 of 2 positions shown · non-contrast
Comparison: 10/17/2019

CLINICAL DATA: Dyspnea

EXAM:
CHEST - 2 VIEW

[chest pa]
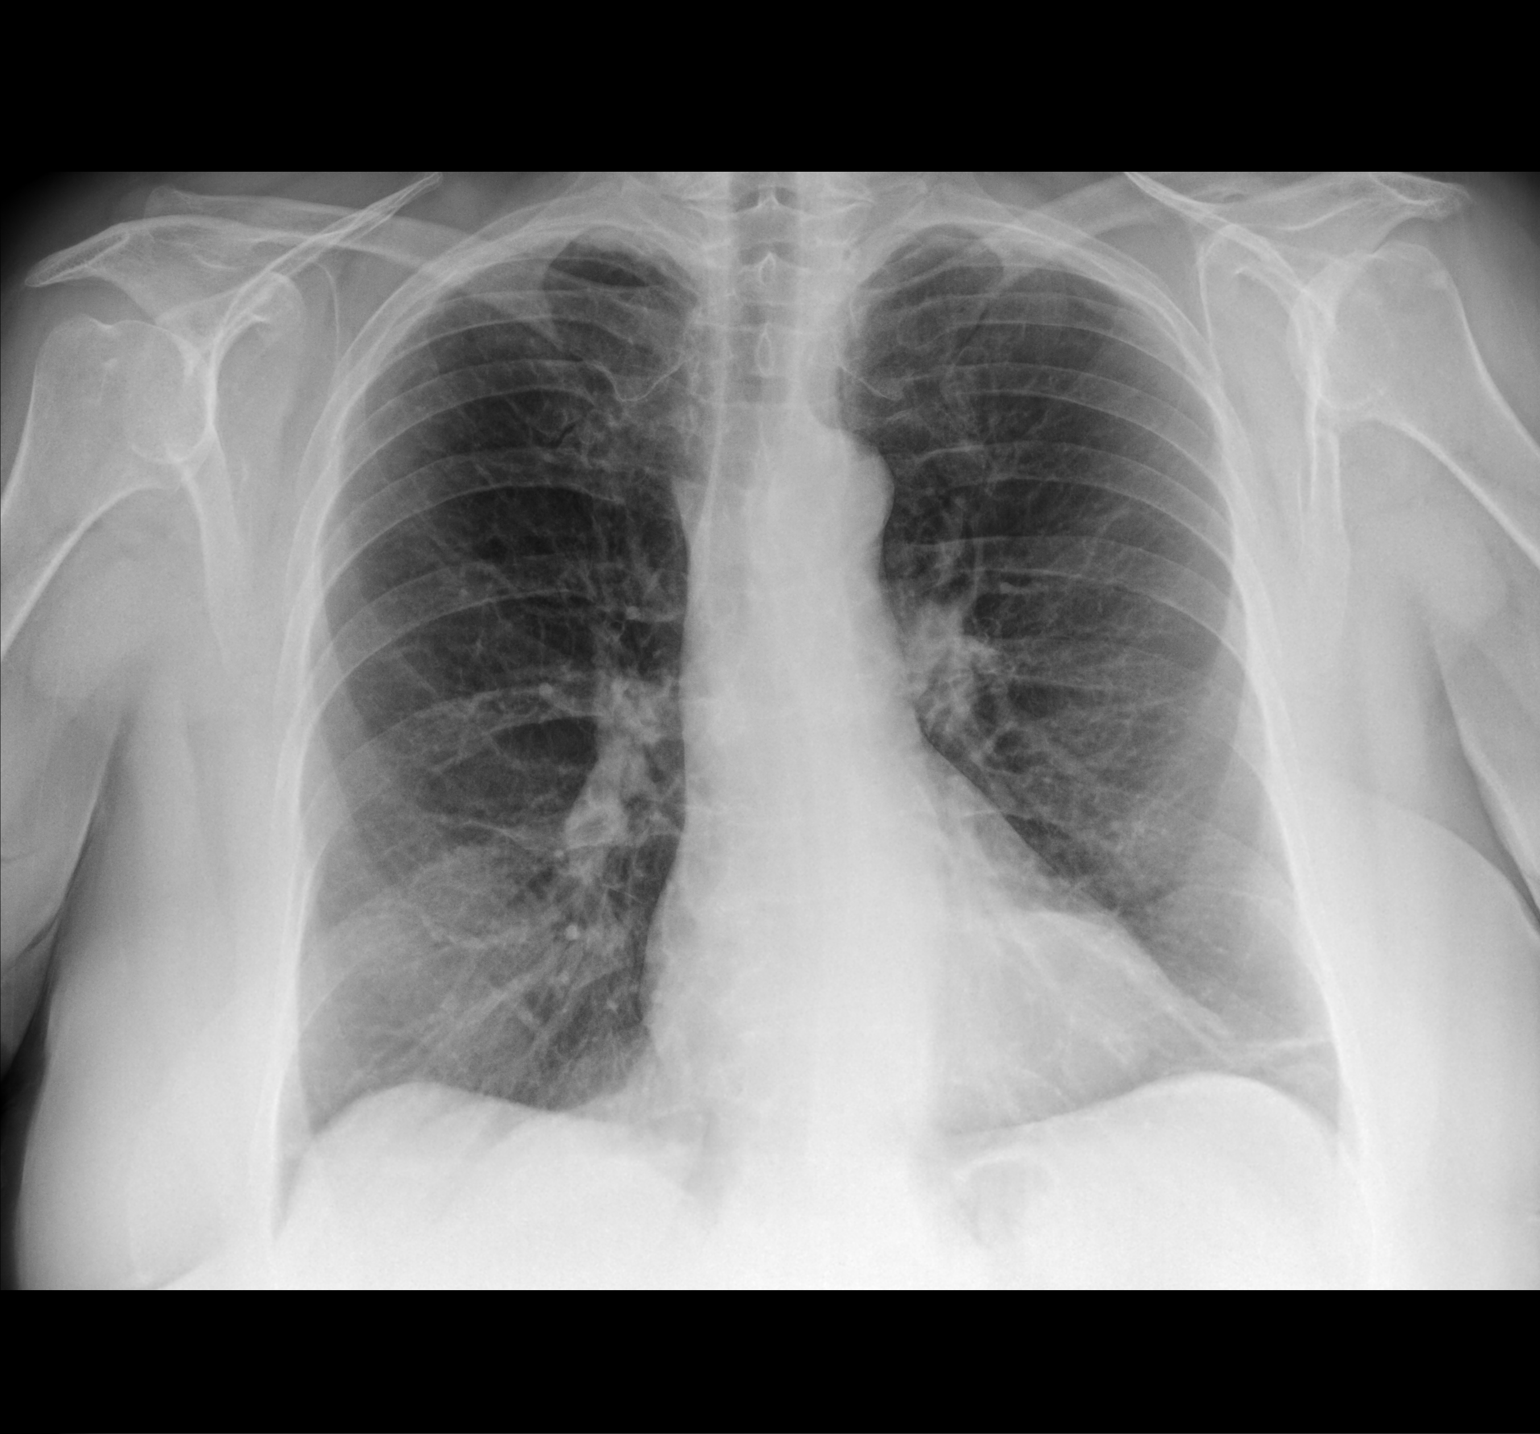

[chest lat]
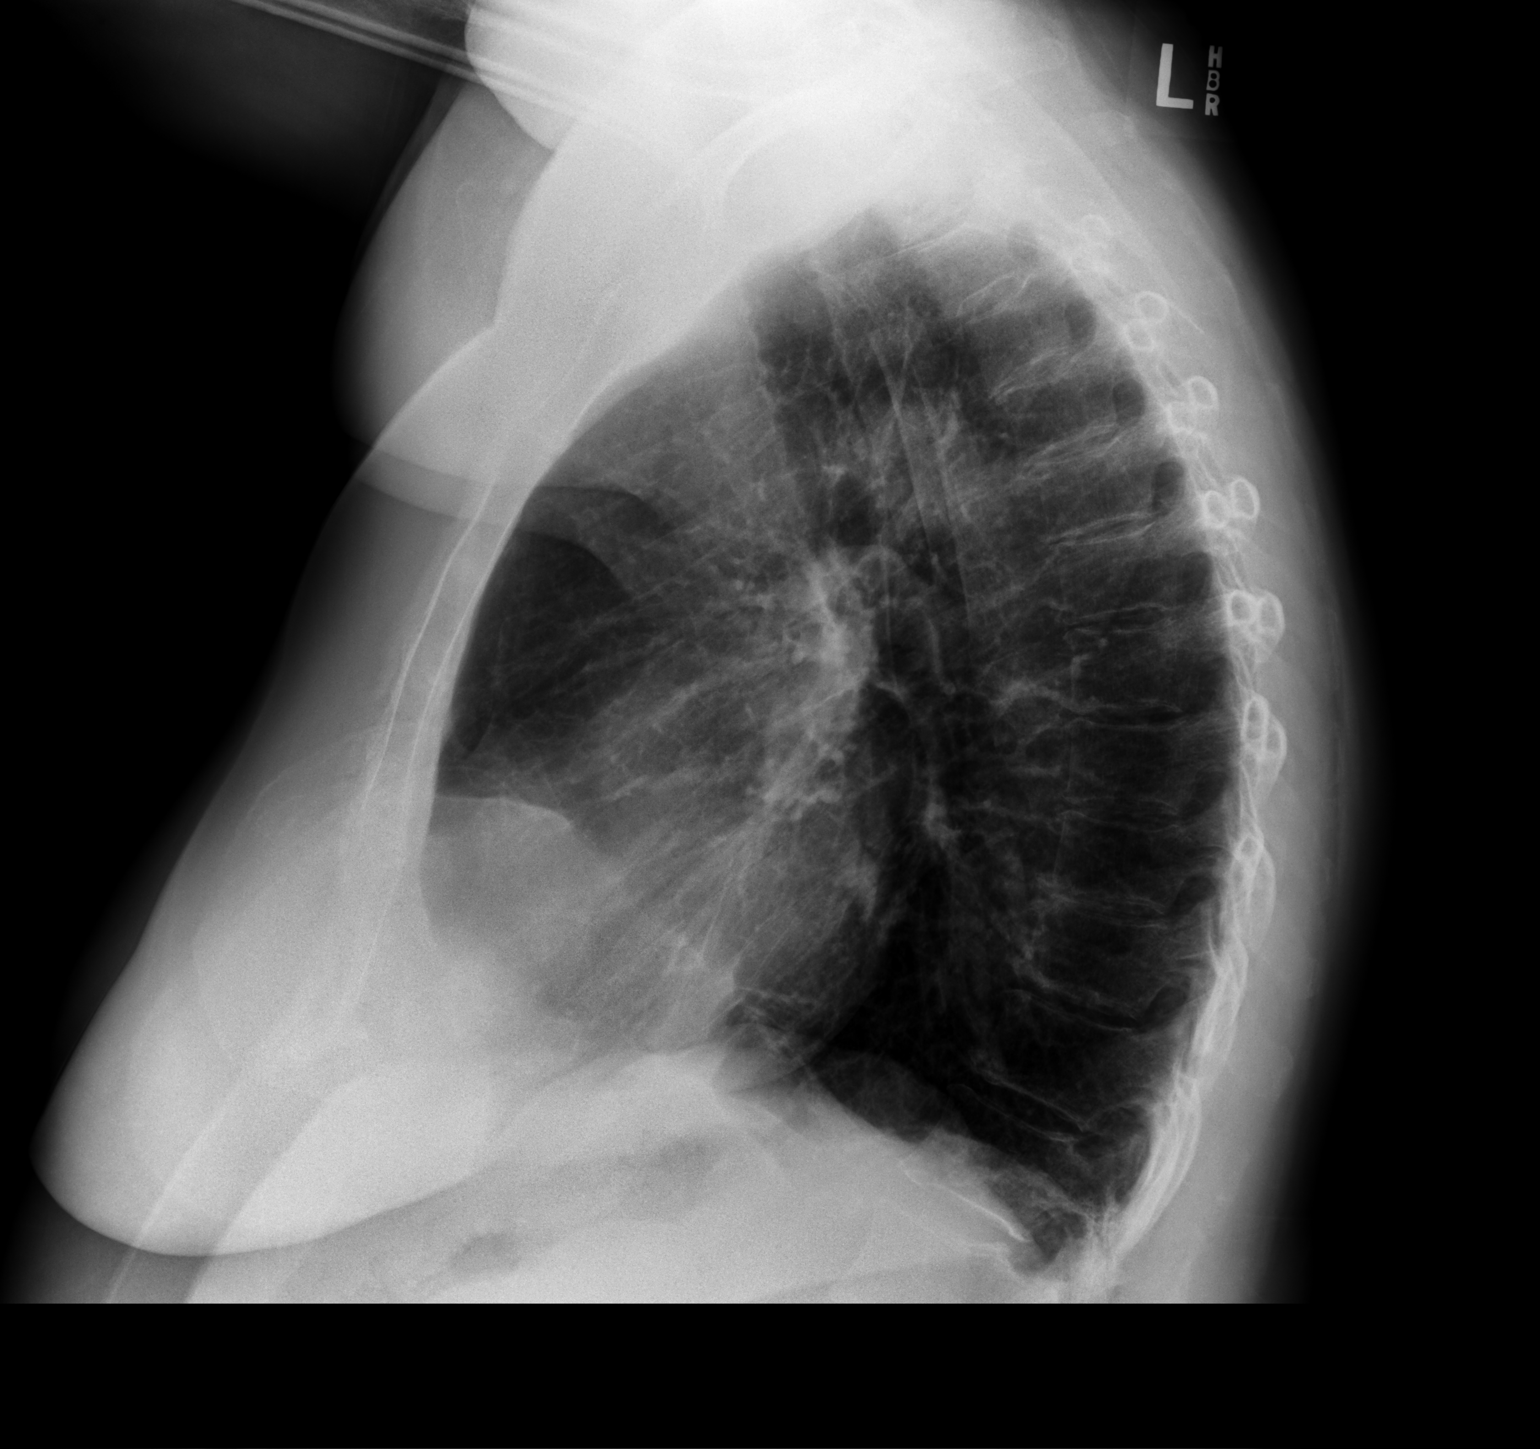

[2 of 2 positions shown; findings below may reference images not displayed]

FINDINGS: The heart size and mediastinal contours are within normal limits.
Linear scarring/atelectasis at the left lung base. No pleural
effusion or pneumothorax. The visualized skeletal structures are
unremarkable.
IMPRESSION: No acute process in the chest.

## 2022-06-21 ENCOUNTER — Inpatient Hospital Stay: Payer: Medicare HMO | Attending: Oncology

## 2022-06-21 ENCOUNTER — Other Ambulatory Visit: Payer: Self-pay | Admitting: Oncology

## 2022-06-21 LAB — CMP (CANCER CENTER ONLY)
ALT: 18 U/L (ref 0–44)
AST: 19 U/L (ref 15–41)
Albumin: 4 g/dL (ref 3.5–5.0)
Alkaline Phosphatase: 85 U/L (ref 38–126)
Anion gap: 9 (ref 5–15)
BUN: 12 mg/dL (ref 8–23)
CO2: 26 mmol/L (ref 22–32)
Calcium: 9.2 mg/dL (ref 8.9–10.3)
Chloride: 106 mmol/L (ref 98–111)
Creatinine: 0.9 mg/dL (ref 0.44–1.00)
GFR, Estimated: >60 mL/min (ref >60)
Glucose, Bld: 88 mg/dL (ref 70–99)
Potassium: 4.2 mmol/L (ref 3.5–5.1)
Sodium: 141 mmol/L (ref 135–145)
Total Bilirubin: 1.1 mg/dL (ref 0.3–1.2)
Total Protein: 6.8 g/dL (ref 6.5–8.1)

## 2022-06-21 LAB — COMPREHENSIVE METABOLIC PANEL
ALT: 16 U/L (ref 0–44)
AST: 17 U/L (ref 15–41)
Albumin: 3.7 g/dL (ref 3.5–5.0)
Alkaline Phosphatase: 84 U/L (ref 38–126)
Anion gap: 8 (ref 5–15)
BUN: 12 mg/dL (ref 8–23)
CO2: 26 mmol/L (ref 22–32)
Calcium: 9.1 mg/dL (ref 8.9–10.3)
Chloride: 106 mmol/L (ref 98–111)
Creatinine, Ser: 0.84 mg/dL (ref 0.44–1.00)
GFR, Estimated: >60 mL/min (ref >60)
Glucose, Bld: 88 mg/dL (ref 70–99)
Potassium: 4.3 mmol/L (ref 3.5–5.1)
Sodium: 140 mmol/L (ref 135–145)
Total Bilirubin: 0.9 mg/dL (ref 0.3–1.2)
Total Protein: 6.2 g/dL — ABNORMAL LOW (ref 6.5–8.1)

## 2022-06-21 LAB — CBC WITH DIFFERENTIAL (CANCER CENTER ONLY)
Abs Immature Granulocytes: 0.01 10*3/uL (ref 0.00–0.07)
Basophils Absolute: 0.1 10*3/uL (ref 0.0–0.1)
Basophils Relative: 1 %
Eosinophils Absolute: 0.2 10*3/uL (ref 0.0–0.5)
Eosinophils Relative: 3 %
HCT: 43 % (ref 36.0–46.0)
Hemoglobin: 13.5 g/dL (ref 12.0–15.0)
Immature Granulocytes: 0 %
Lymphocytes Relative: 31 %
Lymphs Abs: 1.6 10*3/uL (ref 0.7–4.0)
MCH: 27.1 pg (ref 26.0–34.0)
MCHC: 31.4 g/dL (ref 30.0–36.0)
MCV: 86.3 fL (ref 80.0–100.0)
Monocytes Absolute: 0.5 10*3/uL (ref 0.1–1.0)
Monocytes Relative: 9 %
Neutro Abs: 2.9 10*3/uL (ref 1.7–7.7)
Neutrophils Relative %: 56 %
Platelet Count: 301 10*3/uL (ref 150–400)
RBC: 4.98 MIL/uL (ref 3.87–5.11)
RDW: 14.8 % (ref 11.5–15.5)
WBC Count: 5.1 10*3/uL (ref 4.0–10.5)
nRBC: 0 % (ref 0.0–0.2)

## 2022-06-21 LAB — IRON AND TIBC
Iron: 83 ug/dL (ref 28–170)
Saturation Ratios: 23 % (ref 10.4–31.8)
TIBC: 365 ug/dL (ref 250–450)
UIBC: 282 ug/dL

## 2022-06-21 LAB — VITAMIN D 25 HYDROXY (VIT D DEFICIENCY, FRACTURES): Vit D, 25-Hydroxy: 36.56 ng/mL (ref 30–100)

## 2022-06-21 LAB — LIPID PANEL
Cholesterol: 235 mg/dL — ABNORMAL HIGH (ref 0–200)
HDL: 57 mg/dL (ref >40)
LDL Cholesterol: 166 mg/dL — ABNORMAL HIGH (ref 0–99)
Total CHOL/HDL Ratio: 4.1 RATIO
Triglycerides: 62 mg/dL (ref <150)
VLDL: 12 mg/dL (ref 0–40)

## 2022-06-21 LAB — TSH: TSH: 3.585 u[IU]/mL (ref 0.350–4.500)

## 2022-06-21 LAB — T4, FREE: Free T4: 1.01 ng/dL (ref 0.61–1.12)

## 2022-06-21 LAB — FERRITIN: Ferritin: 8 ng/mL — ABNORMAL LOW (ref 11–307)

## 2022-06-22 LAB — HEMOGLOBIN A1C
Hgb A1c MFr Bld: 6 % — ABNORMAL HIGH (ref 4.8–5.6)
Mean Plasma Glucose: 126 mg/dL

## 2022-06-22 NOTE — Progress Notes (Signed)
  [facility]  [address] [place],  [place]  [address] [phone]  Clinic Day:  06/23/2022  Referring physician: Gala Lewandowsky, MD  TELEPHONE APPOINTMENT   HISTORY OF PRESENT ILLNESS:  The patient is a 72 y.o. 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  This telephone visit's primary purpose is to go over her recent iron results with her.  Of note, she has not been phlebotomized in multiple months as her iron parameters have been ideal.  Since her last visit, the patient has been doing okay.  She denies having any new symptoms which concern her for complications related to her underlying hemochromatosis.  PHYSICAL EXAM: DEFERRED   LABS:      Latest Ref Rng & Units 06/21/2022   10:50 AM 12/18/2021   12:00 AM 06/22/2021   12:00 AM  CBC  WBC 4.0 - 10.5 K/uL 5.1  8.4     6.4      Hemoglobin 12.0 - 15.0 g/dL 13.5  13.2     15.7      Hematocrit 36.0 - 46.0 % 43.0  40     47      Platelets 150 - 400 K/uL 301  264     251         This result is from an external source.      Latest Ref Rng & Units 06/21/2022   11:05 AM 06/21/2022   10:50 AM 12/18/2021   12:00 AM  CMP  Glucose 70 - 99 mg/dL 88  88    BUN 8 - 23 mg/dL '12  12  15      '$ Creatinine 0.44 - 1.00 mg/dL 0.84  0.90  0.7      Sodium 135 - 145 mmol/L 140  141  138      Potassium 3.5 - 5.1 mmol/L 4.3  4.2  4.4      Chloride 98 - 111 mmol/L 106  106  106      CO2 22 - 32 mmol/L '26  26  25      '$ Calcium 8.9 - 10.3 mg/dL 9.1  9.2  9.6      Total Protein 6.5 - 8.1 g/dL 6.2  6.8    Total Bilirubin 0.3 - 1.2 mg/dL 0.9  1.1    Alkaline Phos 38 - 126 U/L 84  85  103      AST 15 - 41 U/L '17  19  30      '$ ALT 0 - 44 U/L '16  18  21         '$ This result is from an external source.    Latest Reference Range & Units 06/21/22 10:50  Iron 28 - 170 ug/dL 83  UIBC ug/dL 282  TIBC 250 - 450 ug/dL 365  Saturation Ratios 10.4 - 31.8 % 23  Ferritin 11 - 307 ng/mL 8 (L)  (L): Data is abnormally  low  ASSESSMENT & PLAN:  Assessment/Plan:  A 72 y.o. 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  When evaluating her most recent iron parameters, her ferritin is well below 50.  Furthermore, her other iron parameters remain at normal levels.  Based upon this, she does not need to be phlebotomized over these next months.  I will see her back in 6 months to reassess her iron parameters.  The patient understands all the plans discussed today and is in agreement with them.    Shade Kaley Macarthur Critchley, MD

## 2022-06-23 ENCOUNTER — Inpatient Hospital Stay: Payer: Medicare HMO | Admitting: Oncology

## 2022-06-23 ENCOUNTER — Other Ambulatory Visit: Payer: Self-pay | Admitting: Oncology

## 2022-06-24 ENCOUNTER — Ambulatory Visit (INDEPENDENT_AMBULATORY_CARE_PROVIDER_SITE_OTHER): Payer: Medicare HMO

## 2022-06-24 ENCOUNTER — Ambulatory Visit: Payer: Medicare HMO | Attending: Cardiology

## 2022-06-24 DIAGNOSIS — R0609 Other forms of dyspnea: Secondary | ICD-10-CM | POA: Diagnosis not present

## 2022-06-24 DIAGNOSIS — I6930 Unspecified sequelae of cerebral infarction: Secondary | ICD-10-CM | POA: Diagnosis not present

## 2022-06-24 LAB — ECHOCARDIOGRAM COMPLETE
Area-P 1/2: 3.87 cm2
S' Lateral: 3.5 cm

## 2022-06-29 ENCOUNTER — Telehealth: Payer: Self-pay

## 2022-06-29 NOTE — Telephone Encounter (Signed)
Patient notified of results through my chart.

## 2022-06-29 NOTE — Telephone Encounter (Signed)
-----  Message from Park Liter, MD sent at 06/26/2022  6:24 PM EST ----- Carotic ultrasound showed only mild disease on the left side up to 39%.  Medical therapy

## 2022-06-29 NOTE — Telephone Encounter (Signed)
-----  Message from Park Liter, MD sent at 06/26/2022  6:23 PM EST ----- Echocardiogram showed normal left ventricle ejection fraction, overall looks good

## 2022-07-29 ENCOUNTER — Telehealth: Payer: Self-pay

## 2022-07-29 MED ORDER — DILTIAZEM HCL ER COATED BEADS 180 MG PO CP24: 180.0000 mg | ORAL_CAPSULE | Freq: Every day | ORAL | 11 refills | Status: DC

## 2022-07-29 NOTE — Telephone Encounter (Signed)
Results reviewed with pt as per Dr. Krasowski's note.  Pt verbalized understanding and had no additional questions. Routed to PCP  

## 2022-08-13 ENCOUNTER — Encounter: Payer: Self-pay | Admitting: Oncology

## 2022-08-20 ENCOUNTER — Ambulatory Visit: Payer: Medicare HMO | Admitting: Cardiology

## 2022-09-27 ENCOUNTER — Encounter: Payer: Self-pay | Admitting: *Deleted

## 2022-10-07 ENCOUNTER — Ambulatory Visit: Payer: Medicare HMO | Admitting: Nurse Practitioner

## 2022-10-07 ENCOUNTER — Ambulatory Visit: Payer: Medicare HMO

## 2022-10-07 ENCOUNTER — Encounter: Payer: Self-pay | Admitting: Nurse Practitioner

## 2022-10-07 VITALS — BP 128/76 | HR 63 | Temp 98.0°F | Ht 67.0 in | Wt 267.6 lb

## 2022-10-07 DIAGNOSIS — G4733 Obstructive sleep apnea (adult) (pediatric): Secondary | ICD-10-CM

## 2022-10-07 DIAGNOSIS — I5032 Chronic diastolic (congestive) heart failure: Secondary | ICD-10-CM

## 2022-10-07 DIAGNOSIS — J418 Mixed simple and mucopurulent chronic bronchitis: Secondary | ICD-10-CM

## 2022-10-07 DIAGNOSIS — R0609 Other forms of dyspnea: Secondary | ICD-10-CM | POA: Diagnosis not present

## 2022-10-07 DIAGNOSIS — Z6841 Body Mass Index (BMI) 40.0 and over, adult: Secondary | ICD-10-CM

## 2022-10-07 NOTE — Progress Notes (Addendum)
@Patient  ID: Wendy Gomez, female    DOB: [birth year], 72 y.o.   MRN: [id number]  Chief Complaint  Patient presents with   Follow-up    Cpap machine,     Referring provider: Maris Berger, MD  HPI: 72 year old female, former smoker followed for OSA and DOE. She is a patient of Dr. Reginia Naas and last seen in office 12/12/2019. Past medical history significant for HTN, anxiety.   TEST/EVENTS:  01/15/2020 HST: AHI 9.9/h, SpO2 low 83% 06/24/2022 echo: EF 60-65%, GIDD, RV size and function nl. Normal PASP. No significant valvular abnormalities.   12/12/2019: OV with Dr. Vassie Loll.  Consultation for evaluation of dyspnea on exertion and sleep disordered breathing.  Diagnosed with COPD many years ago on aspirin.  Only uses albuterol MDI.  She was given an Advair inhaler sample but this was too expensive and she could not remove it helped.  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  Bedtime anywhere between 7 PM to 10 PM.  Sleeps on her left side.  Reports 3-4 nocturnal awakenings.  Out of bed anywhere between 3 to 5 AM.  Feels tired.  Gained 50 pounds in the last 2 years.  PFTs and chest x-ray ordered.  If significant obstructive lung disease, would suggest LABA/LAMA combination.  Home sleep study.  10/07/2022: Today - follow up Patient presents today for overdue follow up. Last seen in 2021. She was diagnosed with mild sleep apnea with AHI 9.9/h. She was going to decide if she wanted to start CPAP but never called back. She was lost to follow up after this. She has continued to have daytime tiredness, which she feels has gotten worse over the last few years. She has poor sleep quality and wakes frequently during the night, usually to use the restroom. She does not have a bed partner so is not sure if she snores. She denies any drowsy driving, morning headaches, or sleep parasomnias/paralysis.  She continues to have trouble with her  breathing. She never had PFTs that were previously ordered. She gets short winded with uphill climbing, longer distances, and household chores. She does have recurrent bronchitis as well. She tells me she was treated with steroids/abx in the last 6 months but doesn't remember the exact timing. Cough at baseline is minimally productive with white phlegm. She does notice an occasional wheeze. Symptoms are overall stable over the past year. She has had some increased swelling in her legs, which her PCP put her on lasix for. Her echo in January 2024 showed GIDD but otherwise unremarkable. She denies any palpitations, CP, dizziness/lightheadedness. No fevers, chills, night sweats, hemoptysis, weight loss, anorexia, orthopnea. She does use her albuterol inhaler which helps. Never been on any maintenance inhalers. Quit smoking in 2009; at her most, was smoking 3 packs a day.    Allergies  Allergen Reactions   Atorvastatin Other (See Comments)   Morphine And Related Nausea And Vomiting     There is no immunization history on file for this patient.  Past Medical History:  Diagnosis Date   Anxiety    Connective tissue disease    COPD (chronic obstructive pulmonary disease)    Depression    Diverticulosis    hx of    Endometriosis    GERD (gastroesophageal reflux disease)    Hemochromatosis    Hemochromatosis    History of Helicobacter pylori infection    Irritable bowel  syndrome    Thoracic stomach hernia     Tobacco History: Social History   Tobacco Use  Smoking Status Former   Packs/day: 3.00   Years: 26.00   Additional pack years: 0.00   Total pack years: 78.00   Types: Cigarettes   Start date: 66   Quit date: 07/08/2007   Years since quitting: 15.2  Smokeless Tobacco Never   Counseling given: Not Answered   Outpatient Medications Prior to Visit  Medication Sig Dispense Refill   albuterol (PROVENTIL HFA;VENTOLIN HFA) 108 (90 BASE) MCG/ACT inhaler Inhale 2 puffs into the  lungs every 6 (six) hours as needed for wheezing or shortness of breath.     cholecalciferol (VITAMIN D3) 25 MCG (1000 UT) tablet Take 2,000 Units by mouth daily.     clotrimazole-betamethasone (LOTRISONE) cream Apply 1 Application topically daily. Apply to bottoms of heels daily 45 g 3   diltiazem (CARDIZEM CD) 180 MG 24 hr capsule Take 1 capsule (180 mg total) by mouth daily. 30 capsule 11   ELIQUIS 5 MG TABS tablet Take 5 mg by mouth 2 (two) times daily.     fluticasone (FLONASE) 50 MCG/ACT nasal spray Place 2 sprays into both nostrils as needed for allergies.     furosemide (LASIX) 20 MG tablet Take 20 mg by mouth daily as needed.     meclizine (ANTIVERT) 12.5 MG tablet Take 12.5 mg by mouth 3 (three) times daily as needed for dizziness.     metoprolol tartrate (LOPRESSOR) 25 MG tablet Take 0.5 tablets (12.5 mg total) by mouth 2 (two) times daily. 60 tablet 1   Omega-3 Fatty Acids (FISH OIL) 1000 MG CAPS Take 2 capsules by mouth daily.     omeprazole (PRILOSEC) 20 MG capsule Take 20 mg by mouth daily.     ondansetron (ZOFRAN) 4 MG tablet Take 8 mg by mouth every 6 (six) hours as needed for nausea or vomiting.     Polyethylene Glycol 3350 (MIRALAX PO) Take 1 tablet by mouth daily.     atorvastatin (LIPITOR) 40 MG tablet Take 40 mg by mouth at bedtime. (Patient not taking: Reported on 10/07/2022)     No facility-administered medications prior to visit.     Review of Systems:   Constitutional: No night sweats, fevers, chills, or lassitude. +weight gain, fatigue  HEENT: No headaches, difficulty swallowing, tooth/dental problems, or sore throat. No sneezing, itching, ear ache, nasal congestion, or post nasal drip CV:  +swelling in lower extremities. No chest pain, orthopnea, PND, anasarca, dizziness, palpitations, syncope Resp: +shortness of breath with exertion; chronic cough; occasional wheeze. No excess mucus or change in color of mucus.  No hemoptysis. No chest wall deformity GI:  No  heartburn, indigestion, abdominal pain, nausea, vomiting, diarrhea, change in bowel habits, loss of appetite, bloody stools.  GU: No dysuria, change in color of urine, urgency. +nocturia.   Skin: No rash, lesions, ulcerations MSK:  No joint pain or swelling.   Neuro: No dizziness or lightheadedness.  Psych: No depression or anxiety. Mood stable. +sleep disturbance    Physical Exam:  BP 128/76   Pulse 63   Temp 98 F (36.7 C) (Oral)   Ht 5\' 7"  (1.702 m)   Wt 267 lb 9.6 oz (121.4 kg)   SpO2 99%   BMI 41.91 kg/m   GEN: Pleasant, interactive, well-appearing; obese; in no acute distress. HEENT:  Normocephalic and atraumatic. PERRLA. Sclera white. Nasal turbinates pink, moist and patent bilaterally. No rhinorrhea present. Oropharynx pink and  moist, without exudate or edema. No lesions, ulcerations, or postnasal drip. Mallampati III NECK:  Supple w/ fair ROM. No JVD present. Normal carotid impulses w/o bruits. Thyroid symmetrical with no goiter or nodules palpated. No lymphadenopathy.   CV: RRR, no m/r/g, dependent BLE edema. Pulses intact, +2 bilaterally. No cyanosis, pallor or clubbing. PULMONARY:  Unlabored, regular breathing. Clear bilaterally A&P w/o wheezes/rales/rhonchi. No accessory muscle use.  GI: BS present and normoactive. Soft, non-tender to palpation. No organomegaly or masses detected.  MSK: No erythema, warmth or tenderness. Cap refil <2 sec all extrem. No deformities or joint swelling noted.  Neuro: A/Ox3. No focal deficits noted.   Skin: Warm, no lesions or rashe Psych: Normal affect and behavior. Judgement and thought content appropriate.     Lab Results:  CBC    Component Value Date/Time   WBC 5.1 06/21/2022 1050   WBC 5.7 07/07/2012 1613   RBC 4.98 06/21/2022 1050   HGB 13.5 06/21/2022 1050   HCT 43.0 06/21/2022 1050   PLT 301 06/21/2022 1050   MCV 86.3 06/21/2022 1050   MCV 91 06/22/2021 0000   MCH 27.1 06/21/2022 1050   MCHC 31.4 06/21/2022 1050   RDW  14.8 06/21/2022 1050   LYMPHSABS 1.6 06/21/2022 1050   MONOABS 0.5 06/21/2022 1050   EOSABS 0.2 06/21/2022 1050   BASOSABS 0.1 06/21/2022 1050    BMET    Component Value Date/Time   NA 140 06/21/2022 1105   NA 138 12/18/2021 0000   K 4.3 06/21/2022 1105   CL 106 06/21/2022 1105   CO2 26 06/21/2022 1105   GLUCOSE 88 06/21/2022 1105   BUN 12 06/21/2022 1105   BUN 15 12/18/2021 0000   CREATININE 0.84 06/21/2022 1105   CREATININE 0.90 06/21/2022 1050   CALCIUM 9.1 06/21/2022 1105   GFRNONAA >60 06/21/2022 1105   GFRNONAA >60 06/21/2022 1050   GFRAA >90 07/07/2012 1613    BNP No results found for: "BNP"   Imaging:  No results found.        No data to display          No results found for: "NITRICOXIDE"      Assessment & Plan:   Obstructive sleep apnea Mild OSA on previous HST from 2021. She has worsening daytime fatigue, poor sleep quality and nocturia. She has had a 10 lb weight gain. BMI 41. History of stroke, HTN. Given this,  I am concerned she still has sleep disordered breathing with obstructive sleep apnea. She will need a repeat sleep study for further evaluation.    - discussed how weight can impact sleep and risk for sleep disordered breathing - discussed options to assist with weight loss: combination of diet modification, cardiovascular and strength training exercises   - had an extensive discussion regarding the adverse health consequences related to untreated sleep disordered breathing - specifically discussed the risks for hypertension, coronary artery disease, cardiac dysrhythmias, cerebrovascular disease, and diabetes - lifestyle modification discussed   - discussed how sleep disruption can increase risk of accidents, particularly when driving - safe driving practices were discussed  Patient Instructions  Given your symptoms and history, I am concerned that you may have sleep disordered breathing with sleep apnea. You will need a sleep  study for further evaluation. Someone will contact you to schedule this.   We discussed how untreated sleep apnea puts an individual at risk for cardiac arrhthymias, pulm HTN, DM, stroke and increases their risk for daytime accidents. We also briefly reviewed  treatment options including weight loss, side sleeping position, oral appliance, CPAP therapy or referral to ENT for possible surgical options  Use caution when driving and pull over if you become sleepy.  Pulmonary function testing and chest x ray ordered today  Continue Albuterol inhaler 2 puffs every 6 hours as needed for shortness of breath or wheezing  Follow up in 6 weeks with Dr. Vassie Loll (at [facility]) or Florentina Addison Kylia Grajales,NP to go over sleep study results and PFTs, or sooner, if needed     DOE (dyspnea on exertion) She was told she had COPD many years ago but has never had formal PFTs. Given her heavy smoking history, episodes of recurrent bronchitis, and current symptoms, high suspicion for smoking related obstructive lung disease. She does not have any evidence of PH on recent echo. She did have some mild diastolic dysfunction and dependent BLE edema, which she is now on lasix for. Likely a contributing factor as well as deconditioning. We will obtain PFTs for further evaluation. If evidence of obstruction, we will start her on maintenance regimen with LABA/LAMA therapy. She will continue to use PRN SABA in the interim.    Gomez 3 severe obesity due to excess calories with body mass index (BMI) of 40.0 to 44.9 in adult BMI 41.9. Healthy weight loss encouraged.   Chronic diastolic CHF (congestive heart failure) Mild diastolic dysfunction on recent echo 06/2022. She has dependent BLE edema, which is improving per her report with lasix. Follow up with cardiology/PCP as scheduled.   I spent 42 minutes of dedicated to the care of this patient on the date of this encounter to include pre-visit review of records, face-to-face time with the  patient discussing conditions above, post visit ordering of testing, clinical documentation with the electronic health record, making appropriate referrals as documented, and communicating necessary findings to members of the patients care team.  Noemi Chapel, NP 10/07/2022  Pt aware and understands NP's role.

## 2022-10-07 NOTE — Assessment & Plan Note (Signed)
Mild diastolic dysfunction on recent echo 06/2022. She has dependent BLE edema, which is improving per her report with lasix. Follow up with cardiology/PCP as scheduled.

## 2022-10-07 NOTE — Assessment & Plan Note (Addendum)
Mild OSA on previous HST from 2021. She has worsening daytime fatigue, poor sleep quality and nocturia. She has had a 10 lb weight gain. BMI 41. History of stroke, HTN. Given this,  I am concerned she still has sleep disordered breathing with obstructive sleep apnea. She will need a repeat sleep study for further evaluation.    - discussed how weight can impact sleep and risk for sleep disordered breathing - discussed options to assist with weight loss: combination of diet modification, cardiovascular and strength training exercises   - had an extensive discussion regarding the adverse health consequences related to untreated sleep disordered breathing - specifically discussed the risks for hypertension, coronary artery disease, cardiac dysrhythmias, cerebrovascular disease, and diabetes - lifestyle modification discussed   - discussed how sleep disruption can increase risk of accidents, particularly when driving - safe driving practices were discussed  Patient Instructions  Given your symptoms and history, I am concerned that you may have sleep disordered breathing with sleep apnea. You will need a sleep study for further evaluation. Someone will contact you to schedule this.   We discussed how untreated sleep apnea puts an individual at risk for cardiac arrhthymias, pulm HTN, DM, stroke and increases their risk for daytime accidents. We also briefly reviewed treatment options including weight loss, side sleeping position, oral appliance, CPAP therapy or referral to ENT for possible surgical options  Use caution when driving and pull over if you become sleepy.  Pulmonary function testing and chest x ray ordered today  Continue Albuterol inhaler 2 puffs every 6 hours as needed for shortness of breath or wheezing  Follow up in 6 weeks with Dr. Vassie Loll (at [facility]) or Katie Manas Hickling,NP to go over sleep study results and PFTs, or sooner, if needed

## 2022-10-07 NOTE — Assessment & Plan Note (Signed)
BMI 41.9. Healthy weight loss encouraged.

## 2022-10-07 NOTE — Patient Instructions (Addendum)
Given your symptoms and history, I am concerned that you may have sleep disordered breathing with sleep apnea. You will need a sleep study for further evaluation. Someone will contact you to schedule this.   We discussed how untreated sleep apnea puts an individual at risk for cardiac arrhthymias, pulm HTN, DM, stroke and increases their risk for daytime accidents. We also briefly reviewed treatment options including weight loss, side sleeping position, oral appliance, CPAP therapy or referral to ENT for possible surgical options  Use caution when driving and pull over if you become sleepy.  Pulmonary function testing and chest x ray ordered today  Continue Albuterol inhaler 2 puffs every 6 hours as needed for shortness of breath or wheezing  Follow up in 6 weeks with Dr. Vassie Loll (at [facility]) or Katie Mihran Lebarron,NP to go over sleep study results and PFTs, or sooner, if needed

## 2022-10-07 NOTE — Assessment & Plan Note (Addendum)
She was told she had COPD many years ago but has never had formal PFTs. Given her heavy smoking history, episodes of recurrent bronchitis, and current symptoms, high suspicion for smoking related obstructive lung disease. She does not have any evidence of PH on recent echo. She did have some mild diastolic dysfunction and dependent BLE edema, which she is now on lasix for. Likely a contributing factor as well as deconditioning. We will obtain PFTs for further evaluation. If evidence of obstruction, we will start her on maintenance regimen with LABA/LAMA therapy. She will continue to use PRN SABA in the interim.

## 2022-11-05 ENCOUNTER — Encounter (INDEPENDENT_AMBULATORY_CARE_PROVIDER_SITE_OTHER): Payer: Medicare HMO

## 2022-11-05 DIAGNOSIS — G4733 Obstructive sleep apnea (adult) (pediatric): Secondary | ICD-10-CM | POA: Diagnosis not present

## 2022-12-01 ENCOUNTER — Ambulatory Visit: Payer: Medicare HMO | Admitting: Nurse Practitioner

## 2022-12-01 ENCOUNTER — Ambulatory Visit (INDEPENDENT_AMBULATORY_CARE_PROVIDER_SITE_OTHER): Payer: Medicare HMO | Admitting: Pulmonary Disease

## 2022-12-01 ENCOUNTER — Encounter: Payer: Self-pay | Admitting: Nurse Practitioner

## 2022-12-01 VITALS — BP 118/76 | HR 74 | Ht 67.0 in | Wt 265.0 lb

## 2022-12-01 DIAGNOSIS — Z87891 Personal history of nicotine dependence: Secondary | ICD-10-CM

## 2022-12-01 DIAGNOSIS — J4540 Moderate persistent asthma, uncomplicated: Secondary | ICD-10-CM

## 2022-12-01 DIAGNOSIS — J411 Mucopurulent chronic bronchitis: Secondary | ICD-10-CM | POA: Diagnosis not present

## 2022-12-01 DIAGNOSIS — R0609 Other forms of dyspnea: Secondary | ICD-10-CM | POA: Diagnosis not present

## 2022-12-01 DIAGNOSIS — G4733 Obstructive sleep apnea (adult) (pediatric): Secondary | ICD-10-CM

## 2022-12-01 DIAGNOSIS — J4530 Mild persistent asthma, uncomplicated: Secondary | ICD-10-CM | POA: Insufficient documentation

## 2022-12-01 DIAGNOSIS — J418 Mixed simple and mucopurulent chronic bronchitis: Secondary | ICD-10-CM

## 2022-12-01 DIAGNOSIS — I5032 Chronic diastolic (congestive) heart failure: Secondary | ICD-10-CM

## 2022-12-01 LAB — PULMONARY FUNCTION TEST
DL/VA % pred: 117 %
DL/VA: 4.73 ml/min/mmHg/L
DLCO cor % pred: 119 %
DLCO cor: 25.65 ml/min/mmHg
DLCO unc % pred: 119 %
DLCO unc: 25.65 ml/min/mmHg
FEF 25-75 Post: 2.12 L/sec
FEF 25-75 Pre: 1.38 L/sec
FEF2575-%Change-Post: 52 %
FEF2575-%Pred-Post: 104 %
FEF2575-%Pred-Pre: 68 %
FEV1-%Change-Post: 10 %
FEV1-%Pred-Post: 75 %
FEV1-%Pred-Pre: 68 %
FEV1-Post: 1.92 L
FEV1-Pre: 1.73 L
FEV1FVC-%Change-Post: 5 %
FEV1FVC-%Pred-Pre: 101 %
FEV6-%Change-Post: 5 %
FEV6-%Pred-Post: 74 %
FEV6-%Pred-Pre: 71 %
FEV6-Post: 2.38 L
FEV6-Pre: 2.27 L
FEV6FVC-%Change-Post: 0 %
FEV6FVC-%Pred-Post: 104 %
FEV6FVC-%Pred-Pre: 104 %
FVC-%Change-Post: 5 %
FVC-%Pred-Post: 71 %
FVC-%Pred-Pre: 67 %
FVC-Post: 2.38 L
FVC-Pre: 2.27 L
Post FEV1/FVC ratio: 81 %
Post FEV6/FVC ratio: 100 %
Pre FEV1/FVC ratio: 76 %
Pre FEV6/FVC Ratio: 100 %
RV % pred: 114 %
RV: 2.71 L
TLC % pred: 95 %
TLC: 5.28 L

## 2022-12-01 MED ORDER — FLUTICASONE-SALMETEROL 100-50 MCG/ACT IN AEPB
1.0000 | INHALATION_SPRAY | Freq: Two times a day (BID) | RESPIRATORY_TRACT | 5 refills | Status: DC
Start: 2022-12-01 — End: 2023-05-31

## 2022-12-01 NOTE — Assessment & Plan Note (Signed)
See above

## 2022-12-01 NOTE — Patient Instructions (Signed)
Continue Albuterol inhaler 2 puffs every 6 hours as needed for shortness of breath or wheezing. Notify if symptoms persist despite rescue inhaler/neb use. Continue lasix as directed by cardiology  Trial Advair 1 puff Twice daily. Brush tongue and rinse mouth afterwards.  CT chest - someone will contact you for scheduling  You have mild sleep apnea. We discussed how untreated sleep apnea puts an individual at risk for cardiac arrhthymias, pulm HTN, DM, stroke and increases their risk for daytime accidents. We also briefly reviewed treatment options including weight loss, side sleeping position, oral appliance, CPAP therapy. Let me know what you decide as far as treatment goes.  Follow up in 6 weeks with Dr. Vassie Loll or Philis Nettle to see how new inhaler is working. If symptoms do not improve or worsen, please contact office for sooner follow up or seek emergency care.

## 2022-12-01 NOTE — Assessment & Plan Note (Signed)
Former heavy smoker. She is out of the window for lung cancer screening program as she quit smoking in 2009. She has a significant pack year hx. Will order CT chest given her DOE and hx.

## 2022-12-01 NOTE — Assessment & Plan Note (Signed)
Pulmonary function testing today without formal obstruction. She did have a positive bronchodilator response. Given her heavy smoking history, episodes of recurrent bronchitis, and current symptoms, would recommend trial of ICS/LABA for possible underlying asthma. Rx for Advair sent today - appears to be a Tier 2 medication. Medication education provided. She will let us know if there are any difficulties with coverage. She does not have any evidence of PH on recent echo. She did have some mild diastolic dysfunction and dependent BLE edema, which she is now on lasix for. Likely a contributing factor as well as deconditioning. She has follow up with cardiology.  Patient Instructions  Continue Albuterol inhaler 2 puffs every 6 hours as needed for shortness of breath or wheezing. Notify if symptoms persist despite rescue inhaler/neb use. Continue lasix as directed by cardiology  Trial Advair 1 puff Twice daily. Brush tongue and rinse mouth afterwards.  CT chest - someone will contact you for scheduling  You have mild sleep apnea. We discussed how untreated sleep apnea puts an individual at risk for cardiac arrhthymias, pulm HTN, DM, stroke and increases their risk for daytime accidents. We also briefly reviewed treatment options including weight loss, side sleeping position, oral appliance, CPAP therapy. Let me know what you decide as far as treatment goes.  Follow up in 6 weeks with Dr. Vassie Loll or Philis Nettle to see how new inhaler is working. If symptoms do not improve or worsen, please contact office for sooner follow up or seek emergency care.

## 2022-12-01 NOTE — Assessment & Plan Note (Signed)
She is not significantly volume overloaded today. Weight is stable. She has upcoming follow up with cardiology. Advised she try compression stockings. Monitor weights at home.

## 2022-12-01 NOTE — Progress Notes (Signed)
@Patient  ID: Wendy Gomez, female    DOB: 07/21/1950, 72 y.o.   MRN: [id number]  Chief Complaint  Patient presents with   Follow-up    Pft review     Referring provider: Maris Berger, MD  HPI: 72 year old female, former smoker followed for OSA and DOE. She is a patient of Dr. Reginia Naas and last seen in office 10/07/2022 by [facility] NP. Past medical history significant for HTN, anxiety.   TEST/EVENTS:  01/15/2020 HST: AHI 9.9/h, SpO2 low 83% 06/24/2022 echo: EF 60-65%, GIDD, RV size and function nl. Normal PASP. No significant valvular abnormalities.  10/24/2022 HST: AHI 11.8/h SpO2 low 82% 12/01/2022 PFT: FVC 67, FEV1 68, ratio 81, TLC 95, DLCO cor 119. Positive BD (10%)  12/12/2019: OV with Dr. Vassie Loll.  Consultation for evaluation of dyspnea on exertion and sleep disordered breathing.  Diagnosed with COPD many years ago on aspirin.  Only uses albuterol MDI.  She was given an Advair inhaler sample but this was too expensive and she could not remove it helped.  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  Bedtime anywhere between 7 PM to 10 PM.  Sleeps on her left side.  Reports 3-4 nocturnal awakenings.  Out of bed anywhere between 3 to 5 AM.  Feels tired.  Gained 50 pounds in the last 2 years.  PFTs and chest x-ray ordered.  If significant obstructive lung disease, would suggest LABA/LAMA combination.  Home sleep study.  10/07/2022: OV with [place] Freyre NP for overdue follow up. Last seen in 2021. She was diagnosed with mild sleep apnea with AHI 9.9/h. She was going to decide if she wanted to start CPAP but never called back. She was lost to follow up after this. She has continued to have daytime tiredness, which she feels has gotten worse over the last few years. She has poor sleep quality and wakes frequently during the night, usually to use the restroom. She does not have a bed partner so is not sure if she snores. She denies any  drowsy driving, morning headaches, or sleep parasomnias/paralysis.  She continues to have trouble with her breathing. She never had PFTs that were previously ordered. She gets short winded with uphill climbing, longer distances, and household chores. She does have recurrent bronchitis as well. She tells me she was treated with steroids/abx in the last 6 months but doesn't remember the exact timing. Cough at baseline is minimally productive with white phlegm. She does notice an occasional wheeze. Symptoms are overall stable over the past year. She has had some increased swelling in her legs, which her PCP put her on lasix for. Her echo in January 2024 showed GIDD but otherwise unremarkable. She denies any palpitations, CP, dizziness/lightheadedness. No fevers, chills, night sweats, hemoptysis, weight loss, anorexia, orthopnea. She does use her albuterol inhaler which helps. Never been on any maintenance inhalers. Quit smoking in 2009; at her most, was smoking 3 packs a day.   12/01/2022: Today - follow up Patient presents today for follow up after PFT and HRCT. No formal obstruction or restriction on pulmonary function testing. She has a normal diffusing capacity. She did have a positive bronchodilator response. Feels unchanged compared to her last visit. She is still having trouble with her shortness of breath. No worse than her last visit. She has not had any recent episodes of bronchitis but does tend to get multiple bouts a year. She  has an occasional wheeze. She does have some swelling in her legs, which she is on lasix for. She has an upcoming appt with her cardiologist. She denies any increased cough, chest congestion, orthopnea, CP, palpitations, dizziness/lightheadedness. She does have a significant smoking history. She has been quit for 15 years so not a lung cancer screening program candidate. She denies any hemoptysis, weight loss, anorexia. She was supposed to start Advair years ago but couldn't  afford it at the time.  She had her sleep study, which still showed mild OSA. She does have daytime fatigue and poor sleep quality. She denies drowsy driving. She's not sure what she wants to do for treatment.   Allergies  Allergen Reactions   Atorvastatin Other (See Comments)   Morphine And Codeine Nausea And Vomiting     There is no immunization history on file for this patient.  Past Medical History:  Diagnosis Date   Anxiety    Connective tissue disease (HCC)    COPD (chronic obstructive pulmonary disease) (HCC)    Depression    Diverticulosis    hx of    Endometriosis    GERD (gastroesophageal reflux disease)    Hemochromatosis    Hemochromatosis    History of Helicobacter pylori infection    Irritable bowel syndrome    Thoracic stomach hernia     Tobacco History: Social History   Tobacco Use  Smoking Status Former   Packs/day: 3.00   Years: 26.00   Additional pack years: 0.00   Total pack years: 78.00   Types: Cigarettes   Start date: 48   Quit date: 07/08/2007   Years since quitting: 15.4  Smokeless Tobacco Never   Counseling given: Not Answered   Outpatient Medications Prior to Visit  Medication Sig Dispense Refill   albuterol (PROVENTIL HFA;VENTOLIN HFA) 108 (90 BASE) MCG/ACT inhaler Inhale 2 puffs into the lungs every 6 (six) hours as needed for wheezing or shortness of breath.     atorvastatin (LIPITOR) 40 MG tablet Take 40 mg by mouth at bedtime.     cholecalciferol (VITAMIN D3) 25 MCG (1000 UT) tablet Take 2,000 Units by mouth daily.     clotrimazole-betamethasone (LOTRISONE) cream Apply 1 Application topically daily. Apply to bottoms of heels daily 45 g 3   diltiazem (CARDIZEM CD) 180 MG 24 hr capsule Take 1 capsule (180 mg total) by mouth daily. 30 capsule 11   ELIQUIS 5 MG TABS tablet Take 5 mg by mouth 2 (two) times daily.     fluticasone (FLONASE) 50 MCG/ACT nasal spray Place 2 sprays into both nostrils as needed for allergies.     furosemide  (LASIX) 20 MG tablet Take 20 mg by mouth daily as needed.     meclizine (ANTIVERT) 12.5 MG tablet Take 12.5 mg by mouth 3 (three) times daily as needed for dizziness.     metoprolol tartrate (LOPRESSOR) 25 MG tablet Take 0.5 tablets (12.5 mg total) by mouth 2 (two) times daily. 60 tablet 1   Omega-3 Fatty Acids (FISH OIL) 1000 MG CAPS Take 2 capsules by mouth daily.     omeprazole (PRILOSEC) 20 MG capsule Take 40 mg by mouth daily.     ondansetron (ZOFRAN) 4 MG tablet Take 8 mg by mouth every 6 (six) hours as needed for nausea or vomiting.     Polyethylene Glycol 3350 (MIRALAX PO) Take 1 tablet by mouth daily.     No facility-administered medications prior to visit.     Review of Systems:  Constitutional: No night sweats, fevers, chills, or lassitude. +weight gain, fatigue  HEENT: No headaches, difficulty swallowing, tooth/dental problems, or sore throat. No sneezing, itching, ear ache, nasal congestion, or post nasal drip CV:  +swelling in lower extremities. No chest pain, orthopnea, PND, anasarca, dizziness, palpitations, syncope Resp: +shortness of breath with exertion; chronic cough; occasional wheeze. No excess mucus or change in color of mucus.  No hemoptysis. No chest wall deformity GI:  No heartburn, indigestion, abdominal pain, nausea, vomiting, diarrhea, change in bowel habits, loss of appetite, bloody stools.  GU: No dysuria, change in color of urine, urgency. +nocturia.   Skin: No rash, lesions, ulcerations MSK:  No joint pain or swelling.   Neuro: No dizziness or lightheadedness.  Psych: No depression or anxiety. Mood stable. +sleep disturbance    Physical Exam:  BP 118/76   Pulse 74   Ht 5\' 7"  (1.702 m)   Wt 265 lb (120.2 kg)   SpO2 95%   BMI 41.50 kg/m   GEN: Pleasant, interactive, well-appearing; obese; in no acute distress. HEENT:  Normocephalic and atraumatic. PERRLA. Sclera white. Nasal turbinates pink, moist and patent bilaterally. No rhinorrhea present.  Oropharynx pink and moist, without exudate or edema. No lesions, ulcerations, or postnasal drip. Mallampati III NECK:  Supple w/ fair ROM. No JVD present. Normal carotid impulses w/o bruits. Thyroid symmetrical with no goiter or nodules palpated. No lymphadenopathy.   CV: RRR, no m/r/g, dependent BLE edema. Pulses intact, +2 bilaterally. No cyanosis, pallor or clubbing. PULMONARY:  Unlabored, regular breathing. Clear bilaterally A&P w/o wheezes/rales/rhonchi. No accessory muscle use.  GI: BS present and normoactive. Soft, non-tender to palpation. No organomegaly or masses detected.  MSK: No erythema, warmth or tenderness. Cap refil <2 sec all extrem. No deformities or joint swelling noted.  Neuro: A/Ox3. No focal deficits noted.   Skin: Warm, no lesions or rashe Psych: Normal affect and behavior. Judgement and thought content appropriate.     Lab Results:  CBC    Component Value Date/Time   WBC 5.1 06/21/2022 1050   WBC 5.7 07/07/2012 1613   RBC 4.98 06/21/2022 1050   HGB 13.5 06/21/2022 1050   HCT 43.0 06/21/2022 1050   PLT 301 06/21/2022 1050   MCV 86.3 06/21/2022 1050   MCV 91 06/22/2021 0000   MCH 27.1 06/21/2022 1050   MCHC 31.4 06/21/2022 1050   RDW 14.8 06/21/2022 1050   LYMPHSABS 1.6 06/21/2022 1050   MONOABS 0.5 06/21/2022 1050   EOSABS 0.2 06/21/2022 1050   BASOSABS 0.1 06/21/2022 1050    BMET    Component Value Date/Time   NA 140 06/21/2022 1105   NA 138 12/18/2021 0000   K 4.3 06/21/2022 1105   CL 106 06/21/2022 1105   CO2 26 06/21/2022 1105   GLUCOSE 88 06/21/2022 1105   BUN 12 06/21/2022 1105   BUN 15 12/18/2021 0000   CREATININE 0.84 06/21/2022 1105   CREATININE 0.90 06/21/2022 1050   CALCIUM 9.1 06/21/2022 1105   GFRNONAA >60 06/21/2022 1105   GFRNONAA >60 06/21/2022 1050   GFRAA >90 07/07/2012 1613    BNP No results found for: "BNP"   Imaging:  No results found.       Latest Ref Rng & Units 12/01/2022   11:48 AM  PFT Results  FVC-Pre  L 2.27  P  FVC-Predicted Pre % 67  P  FVC-Post L 2.38  P  FVC-Predicted Post % 71  P  Pre FEV1/FVC % % 76  P  Post FEV1/FCV % % 81  P  FEV1-Pre L 1.73  P  FEV1-Predicted Pre % 68  P  FEV1-Post L 1.92  P  DLCO uncorrected ml/min/mmHg 25.65  P  DLCO UNC% % 119  P  DLCO corrected ml/min/mmHg 25.65  P  DLCO COR %Predicted % 119  P  DLVA Predicted % 117  P  TLC L 5.28  P  TLC % Predicted % 95  P  RV % Predicted % 114  P    P Preliminary result    No results found for: "NITRICOXIDE"      Assessment & Plan:   DOE (dyspnea on exertion) Pulmonary function testing today without formal obstruction. She did have a positive bronchodilator response. Given her heavy smoking history, episodes of recurrent bronchitis, and current symptoms, would recommend trial of ICS/LABA for possible underlying asthma. Rx for Advair sent today - appears to be a Tier 2 medication. Medication education provided. She will let us know if there are any difficulties with coverage. She does not have any evidence of PH on recent echo. She did have some mild diastolic dysfunction and dependent BLE edema, which she is now on lasix for. Likely a contributing factor as well as deconditioning. She has follow up with cardiology.  Patient Instructions  Continue Albuterol inhaler 2 puffs every 6 hours as needed for shortness of breath or wheezing. Notify if symptoms persist despite rescue inhaler/neb use. Continue lasix as directed by cardiology  Trial Advair 1 puff Twice daily. Brush tongue and rinse mouth afterwards.  CT chest - someone will contact you for scheduling  You have mild sleep apnea. We discussed how untreated sleep apnea puts an individual at risk for cardiac arrhthymias, pulm HTN, DM, stroke and increases their risk for daytime accidents. We also briefly reviewed treatment options including weight loss, side sleeping position, oral appliance, CPAP therapy. Let me know what you decide as far as treatment  goes.  Follow up in 6 weeks with Dr. Vassie Loll or Philis Nettle to see how new inhaler is working. If symptoms do not improve or worsen, please contact office for sooner follow up or seek emergency care.    Bronchitis, mucopurulent recurrent (HCC) See above  Obstructive sleep apnea Mild sleep apnea without significant worsening. We discussed risks of untreated OSA and potential treatment options. Given her worsening daytime fatigue and sleep quality, recommended trial of CPAP but she would like to discuss this with her cardiologist first. She will let us know when she has made a decision. Cautioned on safe driving practices.    Chronic diastolic CHF (congestive heart failure) (HCC) She is not significantly volume overloaded today. Weight is stable. She has upcoming follow up with cardiology. Advised she try compression stockings. Monitor weights at home.  Former smoker Former heavy smoker. She is out of the window for lung cancer screening program as she quit smoking in 2009. She has a significant pack year hx. Will order CT chest given her DOE and hx.    I spent 35 minutes of dedicated to the care of this patient on the date of this encounter to include pre-visit review of records, face-to-face time with the patient discussing conditions above, post visit ordering of testing, clinical documentation with the electronic health record, making appropriate referrals as documented, and communicating necessary findings to members of the patients care team.  Noemi Chapel, NP 12/01/2022  Pt aware and understands NP's role.

## 2022-12-01 NOTE — Progress Notes (Signed)
Full PFT performed today. °

## 2022-12-01 NOTE — Patient Instructions (Signed)
Full PFT performed today. °

## 2022-12-01 NOTE — Assessment & Plan Note (Signed)
Mild sleep apnea without significant worsening. We discussed risks of untreated OSA and potential treatment options. Given her worsening daytime fatigue and sleep quality, recommended trial of CPAP but she would like to discuss this with her cardiologist first. She will let us know when she has made a decision. Cautioned on safe driving practices.

## 2022-12-08 ENCOUNTER — Encounter: Payer: Self-pay | Admitting: Oncology

## 2022-12-09 ENCOUNTER — Encounter: Payer: Self-pay | Admitting: Cardiology

## 2022-12-09 ENCOUNTER — Ambulatory Visit: Payer: Medicare HMO | Attending: Cardiology | Admitting: Cardiology

## 2022-12-09 VITALS — BP 118/68 | HR 71 | Ht 67.0 in | Wt 267.0 lb

## 2022-12-09 DIAGNOSIS — I6930 Unspecified sequelae of cerebral infarction: Secondary | ICD-10-CM

## 2022-12-09 DIAGNOSIS — F411 Generalized anxiety disorder: Secondary | ICD-10-CM

## 2022-12-09 DIAGNOSIS — G4733 Obstructive sleep apnea (adult) (pediatric): Secondary | ICD-10-CM | POA: Diagnosis not present

## 2022-12-09 DIAGNOSIS — I5032 Chronic diastolic (congestive) heart failure: Secondary | ICD-10-CM | POA: Diagnosis not present

## 2022-12-09 DIAGNOSIS — I10 Essential (primary) hypertension: Secondary | ICD-10-CM

## 2022-12-09 NOTE — Progress Notes (Signed)
Cardiology Office Note:    Date:  12/09/2022   ID:  Wendy Gomez, [place] 1951-03-22, MRN [id number]  PCP:  Maris Berger, MD  Cardiologist:  Gypsy Balsam, MD    Referring MD: Maris Berger, MD   Chief Complaint  Patient presents with   Leg Swelling    History of Present Illness:    Wendy Gomez is a 72 y.o. female past medical history significant for shortness of breath fatigue, essential hypertension,, hemochromatosis, late effect of CVA was referred to [place] because of shortness of breath.  Concern was also about palpitations.  Initially seen by [place] in 2020, palpitations workup did not show any significant arrhythmia. Comes today to months for follow-up we will doing very well.  She denies any chest pain tightness squeezing pressure burning chest no soreness or swelling of lower extremities Opyd and evening time and still shortness of breath.  So far we did echocardiogram which showed preserved left ventricle ejection fraction, grade 1 diastolic dysfunctions but otherwise everything looks normal, she will monitor which shows some short runs of supraventricular tachycardia on her Cardizem has been increased to 180 from 120 her concern today is swelling legs which could be worse because of increasing dose of Cardizem.  Past Medical History:  Diagnosis Date   Anxiety    Connective tissue disease (HCC)    COPD (chronic obstructive pulmonary disease) (HCC)    Depression    Diverticulosis    hx of    Endometriosis    GERD (gastroesophageal reflux disease)    Hemochromatosis    Hemochromatosis    History of Helicobacter pylori infection    Irritable bowel syndrome    Thoracic stomach hernia     Past Surgical History:  Procedure Laterality Date   ABDOMINAL HYSTERECTOMY     APPENDECTOMY     BREAST ENHANCEMENT SURGERY     CHOLECYSTECTOMY     CHOLECYSTECTOMY     COLONOSCOPY  03/19/2014   Mild sigmoid diverticulosis. External and internal hemorrhoids.  Otherwise normal colonoscopy to TI.    ESOPHAGOGASTRODUODENOSCOPY  03/29/2014   Small hiatal hernia. Minimal gastritis.    LAPAROTOMY      Current Medications: Current Meds  Medication Sig   albuterol (PROVENTIL HFA;VENTOLIN HFA) 108 (90 BASE) MCG/ACT inhaler Inhale 2 puffs into the lungs every 6 (six) hours as needed for wheezing or shortness of breath.   atorvastatin (LIPITOR) 40 MG tablet Take 40 mg by mouth at bedtime.   cholecalciferol (VITAMIN D3) 25 MCG (1000 UT) tablet Take 2,000 Units by mouth daily.   clotrimazole-betamethasone (LOTRISONE) cream Apply 1 Application topically daily. Apply to bottoms of heels daily   diltiazem (CARDIZEM CD) 180 MG 24 hr capsule Take 1 capsule (180 mg total) by mouth daily.   ELIQUIS 5 MG TABS tablet Take 5 mg by mouth 2 (two) times daily.   fluticasone (FLONASE) 50 MCG/ACT nasal spray Place 2 sprays into both nostrils as needed for allergies.   fluticasone-salmeterol (ADVAIR) 100-50 MCG/ACT AEPB Inhale 1 puff into the lungs 2 (two) times daily.   furosemide (LASIX) 20 MG tablet Take 20 mg by mouth daily as needed for fluid or edema.   meclizine (ANTIVERT) 12.5 MG tablet Take 12.5 mg by mouth 3 (three) times daily as needed for dizziness.   metoprolol tartrate (LOPRESSOR) 25 MG tablet Take 0.5 tablets (12.5 mg total) by mouth 2 (two) times daily.   Omega-3 Fatty Acids (FISH OIL) 1000 MG CAPS Take 2 capsules by mouth  daily.   omeprazole (PRILOSEC) 20 MG capsule Take 40 mg by mouth daily.   ondansetron (ZOFRAN) 4 MG tablet Take 8 mg by mouth every 6 (six) hours as needed for nausea or vomiting.   Polyethylene Glycol 3350 (MIRALAX PO) Take 1 tablet by mouth daily.     Allergies:   Atorvastatin and Morphine and codeine   Social History   Socioeconomic History   Marital status: Divorced    Spouse name: Not on file   Number of children: Not on file   Years of education: Not on file   Highest education level: Not on file  Occupational History    Not on file  Tobacco Use   Smoking status: Former    Packs/day: 3.00    Years: 26.00    Additional pack years: 0.00    Total pack years: 78.00    Types: Cigarettes    Start date: 45    Quit date: 07/08/2007    Years since quitting: 15.4   Smokeless tobacco: Never  Vaping Use   Vaping Use: Never used  Substance and Sexual Activity   Alcohol use: No   Drug use: No   Sexual activity: Not on file  Other Topics Concern   Not on file  Social History Narrative   Not on file   Social Determinants of Health   Financial Resource Strain: Not on file  Food Insecurity: Not on file  Transportation Needs: Not on file  Physical Activity: Not on file  Stress: Not on file  Social Connections: Not on file     Family History: The patient's family history includes Colon cancer in her father and paternal uncle; Diabetes in her sister; Hemochromatosis in her sister; Hyperlipidemia in her sister; Hypertension in her sister. There is no history of Rectal cancer, Stomach cancer, or Esophageal cancer. ROS:   Please see the history of present illness.    All 14 point review of systems negative except as described per history of present illness  EKGs/Labs/Other Studies Reviewed:         Recent Labs: 06/21/2022: ALT 16; BUN 12; Creatinine, Ser 0.84; Hemoglobin 13.5; Platelet Count 301; Potassium 4.3; Sodium 140; TSH 3.585  Recent Lipid Panel    Component Value Date/Time   CHOL 235 (H) 06/21/2022 1105   TRIG 62 06/21/2022 1105   HDL 57 06/21/2022 1105   CHOLHDL 4.1 06/21/2022 1105   VLDL 12 06/21/2022 1105   LDLCALC 166 (H) 06/21/2022 1105    Physical Exam:    VS:  BP 118/68 (BP Location: Left Arm, Patient Position: Sitting)   Pulse 71   Ht 5\' 7"  (1.702 m)   Wt 267 lb (121.1 kg)   SpO2 93%   BMI 41.82 kg/m     Wt Readings from Last 3 Encounters:  12/09/22 267 lb (121.1 kg)  12/01/22 265 lb (120.2 kg)  10/07/22 267 lb 9.6 oz (121.4 kg)     GEN:  Well nourished, well developed  in no acute distress HEENT: Normal NECK: No JVD; No carotid bruits LYMPHATICS: No lymphadenopathy CARDIAC: RRR, no murmurs, no rubs, no gallops RESPIRATORY:  Clear to auscultation without rales, wheezing or rhonchi  ABDOMEN: Soft, non-tender, non-distended MUSCULOSKELETAL:  No edema; No deformity  SKIN: Warm and dry LOWER EXTREMITIES: no swelling NEUROLOGIC:  Alert and oriented x 3 PSYCHIATRIC:  Normal affect   ASSESSMENT:    1. Chronic diastolic CHF (congestive heart failure) (HCC)   2. Obstructive sleep apnea   3. Essential hypertension  4. Generalized anxiety disorder   5. Late effect of cerebrovascular accident (CVA)    PLAN:    In order of problems listed above:  Chronic diastolic congestive heart failure.  Will check proBNP and Chem-7 today proBNP is elevated will increase initiate Lasix she does have 20 mg of Lasix taking on as-needed basis but does not take it she said it does not help so most likely will put her on higher dose of 40 mg with some potassium supplementation. Obstructive sleep apnea followed by antimedicine team. Essential hypertension blood pressure seems to be well-controlled we will continue present management. Dyslipidemia she was put on Lipitor 40.  Will make arrangements for her cholesterol to be rechecked   Medication Adjustments/Labs and Tests Ordered: Current medicines are reviewed at length with the patient today.  Concerns regarding medicines are outlined above.  Orders Placed This Encounter  Procedures   Basic metabolic panel   Pro b natriuretic peptide (BNP)   Medication changes: No orders of the defined types were placed in this encounter.   Signed, Georgeanna Lea, MD, [facility] 12/09/2022 3:53 PM    [facility]

## 2022-12-09 NOTE — Patient Instructions (Signed)
Medication Instructions:  Your physician recommends that you continue on your current medications as directed. Please refer to the Current Medication list given to you today.  *If you need a refill on your cardiac medications before your next appointment, please call your pharmacy*   Lab Work: Your physician recommends that you have a BMP and ProBNP today.  If you have labs (blood work) drawn today and your tests are completely normal, you will receive your results only by: MyChart Message (if you have MyChart) OR A paper copy in the mail If you have any lab test that is abnormal or we need to change your treatment, we will call you to review the results.   Testing/Procedures: None ordered   Follow-Up: At [facility], you and your health needs are our priority.  As part of our continuing mission to provide you with exceptional heart care, we have created designated Provider Care Teams.  These Care Teams include your primary Cardiologist (physician) and Advanced Practice Providers (APPs -  Physician Assistants and Nurse Practitioners) who all work together to provide you with the care you need, when you need it.  We recommend signing up for the patient portal called "MyChart".  Sign up information is provided on this After Visit Summary.  MyChart is used to connect with patients for Virtual Visits (Telemedicine).  Patients are able to view lab/test results, encounter notes, upcoming appointments, etc.  Non-urgent messages can be sent to your provider as well.   To learn more about what you can do with MyChart, go to [email or web address].    Your next appointment:   3 month(s)  The format for your next appointment:   In Person  Provider:   Gypsy Balsam, MD    Other Instructions none  Important Information About Sugar

## 2022-12-10 LAB — BASIC METABOLIC PANEL
BUN/Creatinine Ratio: 16 (ref 12–28)
BUN: 12 mg/dL (ref 8–27)
CO2: 21 mmol/L (ref 20–29)
Calcium: 9.7 mg/dL (ref 8.7–10.3)
Chloride: 106 mmol/L (ref 96–106)
Creatinine, Ser: 0.77 mg/dL (ref 0.57–1.00)
Glucose: 112 mg/dL — ABNORMAL HIGH (ref 70–99)
Potassium: 4.7 mmol/L (ref 3.5–5.2)
Sodium: 142 mmol/L (ref 134–144)
eGFR: 82 mL/min/{1.73_m2} (ref >59)

## 2022-12-10 LAB — PRO B NATRIURETIC PEPTIDE: NT-Pro BNP: 120 pg/mL (ref 0–301)

## 2022-12-15 ENCOUNTER — Telehealth: Payer: Self-pay | Admitting: Cardiology

## 2022-12-15 NOTE — Telephone Encounter (Signed)
Pt c/o medication issue:  1. Name of Medication:     furosemide (LASIX) 20 MG tablet   2. How are you currently taking this medication (dosage and times per day)?     3. Are you having a reaction (difficulty breathing--STAT)?   4. What is your medication issue?   Patient stated she had needs a call back to discuss dosage changes for this medication and potassium tablets she is supposed to be taking.

## 2022-12-15 NOTE — Telephone Encounter (Signed)
Pt states that she has not heard anything about if she was to change her medications. I think maybe you were waiting on her lab results.  Chronic diastolic congestive heart failure. Will check proBNP and Chem-7 today proBNP is elevated will increase initiate Lasix she does have 20 mg of Lasix taking on as-needed basis but does not take it she said it does not help so most likely will put her on higher dose of 40 mg with some potassium supplementation.   12/10/2022  5:38 AM - Interface, [facility] Lab Results In  Component Value Flag Ref Range Units Status  Glucose 112  High  70 - 99 mg/dL Final  BUN 12  8 - 27 mg/dL Final  Creatinine, Ser 0.77  0.57 - 1.00 mg/dL Final  eGFR 82  >19 JY/NWG/9.56 Final  BUN/Creatinine Ratio 16  12 - 28  Final  Sodium 142  134 - 144 mmol/L Final  Potassium 4.7  3.5 - 5.2 mmol/L Final  Chloride 106  96 - 106 mmol/L Final  CO2 21  20 - 29 mmol/L Final  Calcium 9.7  8.7 - 10.3 mg/dL Final   07/28/863  7:84 AM - Interface, [facility] Lab Results In  Component Value Flag Ref Range Units Status  NT-Pro BNP 120  0 - 301 pg/mL Final  Comment:  The following cut-points have been suggested for the use of proBNP for the diagnostic evaluation of heart failure (HF) in patients with acute dyspnea: Modality                     Age           Optimal Cut                            (years)            Point ------------------------------------------------------ Diagnosis (rule in HF)        <50            450 pg/mL                           50 - 75            900 pg/mL                               >75           1800 pg/mL Exclusion (rule out HF)  Age independent     300 pg/mL

## 2022-12-17 ENCOUNTER — Telehealth: Payer: Self-pay

## 2022-12-17 MED ORDER — POTASSIUM CHLORIDE ER 10 MEQ PO TBCR: 10.0000 meq | EXTENDED_RELEASE_TABLET | Freq: Every day | ORAL | 3 refills | Status: DC

## 2022-12-17 MED ORDER — FUROSEMIDE 20 MG PO TABS: 40.0000 mg | ORAL_TABLET | Freq: Every day | ORAL | 3 refills | Status: DC

## 2022-12-17 NOTE — Telephone Encounter (Signed)
Spoke with pt about lab results per Dr. Vanetta Shawl note. Will send in Lasix and Potassium to [facility]. Pt agreed to come for labs in 1 week. Routed to PCP

## 2022-12-21 ENCOUNTER — Inpatient Hospital Stay: Payer: Medicare HMO | Attending: Oncology

## 2022-12-21 LAB — CMP (CANCER CENTER ONLY)
ALT: 17 U/L (ref 0–44)
AST: 20 U/L (ref 15–41)
Albumin: 4 g/dL (ref 3.5–5.0)
Alkaline Phosphatase: 80 U/L (ref 38–126)
Anion gap: 9 (ref 5–15)
BUN: 10 mg/dL (ref 8–23)
CO2: 24 mmol/L (ref 22–32)
Calcium: 9.4 mg/dL (ref 8.9–10.3)
Chloride: 107 mmol/L (ref 98–111)
Creatinine: 0.83 mg/dL (ref 0.44–1.00)
GFR, Estimated: >60 mL/min (ref >60)
Glucose, Bld: 104 mg/dL — ABNORMAL HIGH (ref 70–99)
Potassium: 4.3 mmol/L (ref 3.5–5.1)
Sodium: 140 mmol/L (ref 135–145)
Total Bilirubin: 0.8 mg/dL (ref 0.3–1.2)
Total Protein: 7.2 g/dL (ref 6.5–8.1)

## 2022-12-21 LAB — CBC WITH DIFFERENTIAL (CANCER CENTER ONLY)
Abs Immature Granulocytes: 0.01 10*3/uL (ref 0.00–0.07)
Basophils Absolute: 0.1 10*3/uL (ref 0.0–0.1)
Basophils Relative: 1 %
Eosinophils Absolute: 0.2 10*3/uL (ref 0.0–0.5)
Eosinophils Relative: 4 %
HCT: 41.7 % (ref 36.0–46.0)
Hemoglobin: 12.9 g/dL (ref 12.0–15.0)
Immature Granulocytes: 0 %
Lymphocytes Relative: 34 %
Lymphs Abs: 1.8 10*3/uL (ref 0.7–4.0)
MCH: 26.4 pg (ref 26.0–34.0)
MCHC: 30.9 g/dL (ref 30.0–36.0)
MCV: 85.3 fL (ref 80.0–100.0)
Monocytes Absolute: 0.5 10*3/uL (ref 0.1–1.0)
Monocytes Relative: 10 %
Neutro Abs: 2.7 10*3/uL (ref 1.7–7.7)
Neutrophils Relative %: 51 %
Platelet Count: 277 10*3/uL (ref 150–400)
RBC: 4.89 MIL/uL (ref 3.87–5.11)
RDW: 14.5 % (ref 11.5–15.5)
WBC Count: 5.2 10*3/uL (ref 4.0–10.5)
nRBC: 0 % (ref 0.0–0.2)

## 2022-12-21 LAB — IRON AND TIBC
Iron: 58 ug/dL (ref 28–170)
Saturation Ratios: 16 % (ref 10.4–31.8)
TIBC: 353 ug/dL (ref 250–450)
UIBC: 295 ug/dL

## 2022-12-21 LAB — FERRITIN: Ferritin: 7 ng/mL — ABNORMAL LOW (ref 11–307)

## 2022-12-22 ENCOUNTER — Inpatient Hospital Stay (INDEPENDENT_AMBULATORY_CARE_PROVIDER_SITE_OTHER): Payer: Medicare HMO | Admitting: Oncology

## 2022-12-22 NOTE — Progress Notes (Signed)
  [facility]  [address] [place],  [place]  [address] [phone]  Clinic Day:  12/22/2022  Referring physician: Maris Berger, MD  TELEPHONE APPOINTMENT   HISTORY OF PRESENT ILLNESS:  The patient is a 72 y.o. 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  This telephone visit's primary purpose is to go over her recent iron results.  Of note, she has not been phlebotomized in multiple months as her iron parameters have been ideal.  Since her last visit, the patient has been doing okay.  She denies having any new symptoms which concern her for complications related to her underlying hemochromatosis.  PHYSICAL EXAM: DEFERRED   LABS:      Latest Ref Rng & Units 12/21/2022    8:23 AM 06/21/2022   10:50 AM 12/18/2021   12:00 AM  CBC  WBC 4.0 - 10.5 K/uL 5.2  5.1  8.4      Hemoglobin 12.0 - 15.0 g/dL 95.6  21.3  08.6      Hematocrit 36.0 - 46.0 % 41.7  43.0  40      Platelets 150 - 400 K/uL 277  301  264         This result is from an external source.      Latest Ref Rng & Units 12/21/2022    8:23 AM 12/09/2022    2:54 PM 06/21/2022   11:05 AM  CMP  Glucose 70 - 99 mg/dL 578  469  88   BUN 8 - 23 mg/dL 10  12  12    Creatinine 0.44 - 1.00 mg/dL 6.29  5.28  4.13   Sodium 135 - 145 mmol/L 140  142  140   Potassium 3.5 - 5.1 mmol/L 4.3  4.7  4.3   Chloride 98 - 111 mmol/L 107  106  106   CO2 22 - 32 mmol/L 24  21  26    Calcium 8.9 - 10.3 mg/dL 9.4  9.7  9.1   Total Protein 6.5 - 8.1 g/dL 7.2   6.2   Total Bilirubin 0.3 - 1.2 mg/dL 0.8   0.9   Alkaline Phos 38 - 126 U/L 80   84   AST 15 - 41 U/L 20   17   ALT 0 - 44 U/L 17   16     Latest Reference Range & Units 12/21/22 08:22  Iron 28 - 170 ug/dL 58  UIBC ug/dL 244  TIBC 010 - 272 ug/dL 536  Saturation Ratios 10.4 - 31.8 % 16  Ferritin 11 - 307 ng/mL 7 (L)  (L): Data is abnormally low  ASSESSMENT & PLAN:  Assessment/Plan:  A 72 y.o. 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  When  evaluating her most recent iron parameters, her ferritin remains well below 50.  Furthermore, her other iron parameters remain at normal levels.  Based upon this, she does not need to be phlebotomized over these next months.  I will see her back in 6 months to reassess her iron parameters.  The patient understands all the plans discussed today and is in agreement with them.    Corbyn Wildey Kirby Funk, MD

## 2022-12-23 ENCOUNTER — Telehealth: Payer: Self-pay | Admitting: Oncology

## 2022-12-23 NOTE — Telephone Encounter (Signed)
Patient has been scheduled. Aware of appt date and time   Scheduling Message Entered by Rennis Harding A on 12/22/2022 at  3:12 PM Priority: Routine <No visit type provided>  Department: [facility]  Provider:  Scheduling Notes:  Labs 06-23-23  Telephone appt on 06-24-23

## 2023-01-12 ENCOUNTER — Ambulatory Visit (HOSPITAL_BASED_OUTPATIENT_CLINIC_OR_DEPARTMENT_OTHER)
Admission: RE | Admit: 2023-01-12 | Discharge: 2023-01-12 | Disposition: A | Discharge status: Home / Self Care | Payer: Medicare HMO | Source: Ambulatory Visit | Attending: Nurse Practitioner | Admitting: Nurse Practitioner

## 2023-01-12 DIAGNOSIS — J4540 Moderate persistent asthma, uncomplicated: Secondary | ICD-10-CM | POA: Diagnosis present

## 2023-01-12 DIAGNOSIS — R0609 Other forms of dyspnea: Secondary | ICD-10-CM | POA: Insufficient documentation

## 2023-02-01 ENCOUNTER — Encounter (HOSPITAL_BASED_OUTPATIENT_CLINIC_OR_DEPARTMENT_OTHER): Payer: Self-pay | Admitting: Pulmonary Disease

## 2023-02-01 ENCOUNTER — Ambulatory Visit (HOSPITAL_BASED_OUTPATIENT_CLINIC_OR_DEPARTMENT_OTHER): Payer: Medicare HMO | Admitting: Pulmonary Disease

## 2023-02-01 VITALS — BP 130/84 | HR 79 | Resp 14 | Ht 67.0 in | Wt 261.0 lb

## 2023-02-01 DIAGNOSIS — G4733 Obstructive sleep apnea (adult) (pediatric): Secondary | ICD-10-CM

## 2023-02-01 DIAGNOSIS — R0609 Other forms of dyspnea: Secondary | ICD-10-CM | POA: Diagnosis not present

## 2023-02-01 DIAGNOSIS — R911 Solitary pulmonary nodule: Secondary | ICD-10-CM

## 2023-02-01 NOTE — Assessment & Plan Note (Signed)
This is mild and since she does not have significant daytime somnolence or sleep disruption, she prefers to hold off on therapy. I have asked her to focus on weight loss as the main therapy and perhaps avoid sleeping in the supine position is much as possible

## 2023-02-01 NOTE — Progress Notes (Signed)
   Subjective:    Patient ID: Wendy Gomez, female    DOB: [birth year], 72 y.o.   MRN: [id number]  HPI  72 yo former smoker followed for OSA and DOE.  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     She was evaluated for mild OSA in 11/2019. She then reestablished 09/2022 for shortness of breath 11/2022 OV >> Given her heavy smoking history, episodes of recurrent bronchitis, and current symptoms>> trial of ICS/LABA for possible underlying asthma   70-month follow-up visit today. She reports feeling slightly better.  She was started on Lasix by cardiology and her pedal edema has significantly decreased She was started back on Advair and she feels she has not taken this long enough to see if this is helping or not. CT chest was obtained 12/2022 which shows right lower lobe 1 cm nodule. On my review this was not present on CT abdomen 12/2018  Significant tests/ events reviewed  01/15/2020 HST: AHI 9.9/h, SpO2 low 83% 06/24/2022 echo: EF 60-65%, GIDD, RV size and function nl. Normal PASP. No significant valvular abnormalities.  10/24/2022 HST: AHI 11.8/h SpO2 low 82% 12/01/2022 PFT: FVC 67, FEV1 68, ratio 81, TLC 95, DLCO cor 119. Positive BD (10%)  Review of Systems neg for any significant sore throat, dysphagia, itching, sneezing, nasal congestion or excess/ purulent secretions, fever, chills, sweats, unintended wt loss, pleuritic or exertional cp, hempoptysis, orthopnea pnd or change in chronic leg swelling. Also denies presyncope, palpitations, heartburn, abdominal pain, nausea, vomiting, diarrhea or change in bowel or urinary habits, dysuria,hematuria, rash, arthralgias, visual complaints, headache, numbness weakness or ataxia.     Objective:   Physical Exam  Gen. Pleasant, obese, in no distress ENT - no lesions, no post nasal drip Neck: No JVD, no  thyromegaly, no carotid bruits Lungs: no use of accessory muscles, no dullness to percussion, decreased without rales or rhonchi  Cardiovascular: Rhythm regular, heart sounds  normal, no murmurs or gallops, no peripheral edema Musculoskeletal: No deformities, no cyanosis or clubbing , no tremors       Assessment & Plan:

## 2023-02-01 NOTE — Assessment & Plan Note (Addendum)
Right lower lobe 1 cm 12/2022 which was not present on CT abdomen 12/2018 This will need 3 to 72-month follow-up CT chest

## 2023-02-01 NOTE — Assessment & Plan Note (Signed)
It seems that her shortness of breath was more related to pedal edema and Lasix as really helped. She was also started on Advair simultaneously and I am not sure that she needs this long-term but we will continue this for now

## 2023-02-01 NOTE — Patient Instructions (Addendum)
Use advair - 1 puff twice daily  Rinse mouth after use  We decided to hold off on CPAP therapy  X Ct chest wo con to follow up on 1 cm nodule in right lower lung in 3-4 months

## 2023-02-07 ENCOUNTER — Other Ambulatory Visit: Payer: Self-pay | Admitting: Nurse Practitioner

## 2023-02-07 DIAGNOSIS — R911 Solitary pulmonary nodule: Secondary | ICD-10-CM

## 2023-02-07 NOTE — Progress Notes (Signed)
Addressed at appt with Dr. Vassie Loll 8/20. Order was not placed for repeat CT in 3-4 months. Will place order now.

## 2023-03-11 ENCOUNTER — Ambulatory Visit: Payer: Medicare HMO | Admitting: Cardiology

## 2023-05-02 ENCOUNTER — Other Ambulatory Visit: Payer: Self-pay | Admitting: Podiatrist

## 2023-05-11 ENCOUNTER — Encounter: Payer: Self-pay | Admitting: Oncology

## 2023-05-11 ENCOUNTER — Ambulatory Visit
Admission: RE | Admit: 2023-05-11 | Discharge: 2023-05-11 | Disposition: A | Discharge status: Home / Self Care | Payer: Medicare HMO | Source: Ambulatory Visit | Attending: Nurse Practitioner | Admitting: Nurse Practitioner

## 2023-05-11 DIAGNOSIS — R911 Solitary pulmonary nodule: Secondary | ICD-10-CM

## 2023-05-24 ENCOUNTER — Encounter: Payer: Self-pay | Admitting: Oncology

## 2023-05-26 ENCOUNTER — Telehealth (INDEPENDENT_AMBULATORY_CARE_PROVIDER_SITE_OTHER): Payer: Medicare HMO | Admitting: Pulmonary Disease

## 2023-05-26 ENCOUNTER — Encounter (HOSPITAL_BASED_OUTPATIENT_CLINIC_OR_DEPARTMENT_OTHER): Payer: Self-pay | Admitting: Pulmonary Disease

## 2023-05-26 DIAGNOSIS — J45998 Other asthma: Secondary | ICD-10-CM | POA: Diagnosis not present

## 2023-05-26 DIAGNOSIS — R911 Solitary pulmonary nodule: Secondary | ICD-10-CM | POA: Diagnosis not present

## 2023-05-26 NOTE — Patient Instructions (Addendum)
X Ct chest wo con in 1 year dec 2025

## 2023-05-26 NOTE — Progress Notes (Signed)
   Subjective:    Patient ID: Wendy Gomez, female    DOB: 20-Aug-1950, 72 y.o.   MRN: [id number]  HPI  I connected with  Wendy Gomez on 05/26/23 by  video enabled telemedicine application and verified that I am speaking with the correct person using two identifiers.     Location: Patient: Home Provider: Office - [facility] [employer]   I discussed the limitations of evaluation and management by telemedicine and the availability of in person appointments. The patient expressed understanding and agreed to proceed. I also discussed with the patient that there may be a patient responsible charge related to this service. The patient expressed understanding and agreed to proceed.   Patient consented to consult via tele: Yes People present and their role in pt care: Pt   72 yo former smoker followed for mild OSA and DOE.  She smoked about 1 pack/day for about 25 years before she quit in 2009 circadian rhythm disorder -she was waking up at 3 AM for the last 2.5 years due to work.      11/2022 OV >> Given her heavy smoking history, episodes of recurrent bronchitis, and current symptoms>> trial of ICS/LABA for possible underlying asthma   CT chest  12/2022 showed right lower lobe 1 cm nodule, not present on CT abdomen 12/2018   100-month follow-up visit. Her breathing is much improved after taking Advair. We reviewed CT scans today which showed decrease size of nodules and focal scarring  Significant tests/ events reviewed  04/2023 CTc hest >> decreased 5mm nodule vs focal scarring  01/15/2020 HST: AHI 9.9/h, SpO2 low 83% 06/24/2022 echo: EF 60-65%, GIDD, RV size and function nl. Normal PASP. No significant valvular abnormalities.  10/24/2022 HST: AHI 11.8/h SpO2 low 82% 12/01/2022 PFT: FVC 67, FEV1 68, ratio 81, TLC 95, DLCO cor 119. Positive BD (10%)  Review of Systems neg for any significant sore throat, dysphagia, itching, sneezing, nasal congestion or  excess/ purulent secretions, fever, chills, sweats, unintended wt loss, pleuritic or exertional cp, hempoptysis, orthopnea pnd or change in chronic leg swelling. Also denies presyncope, palpitations, heartburn, abdominal pain, nausea, vomiting, diarrhea or change in bowel or urinary habits, dysuria,hematuria, rash, arthralgias, visual complaints, headache, numbness weakness or ataxia.     Objective:   Physical Exam  No accessory muscle use Able to speak in full sentences     Assessment & Plan:   Pulmonary nodule -decreased in size and appears to be scarring Can repeat CT scan in 1 year which would also serve as screening CT.  Asthma -good response to Advair, will provide refills  OSA -has refused treatment in the past.  97-month follow-up visit can be scheduled.  My total encounter time was 22 minutes

## 2023-05-28 ENCOUNTER — Other Ambulatory Visit: Payer: Self-pay | Admitting: Nurse Practitioner

## 2023-05-28 DIAGNOSIS — J4540 Moderate persistent asthma, uncomplicated: Secondary | ICD-10-CM

## 2023-06-22 ENCOUNTER — Encounter: Payer: Self-pay | Admitting: Oncology

## 2023-06-23 ENCOUNTER — Inpatient Hospital Stay: Payer: Medicare Other

## 2023-06-24 ENCOUNTER — Telehealth: Payer: Medicare HMO | Admitting: Oncology

## 2023-06-28 ENCOUNTER — Ambulatory Visit (HOSPITAL_BASED_OUTPATIENT_CLINIC_OR_DEPARTMENT_OTHER): Payer: Medicare HMO | Admitting: Pulmonary Disease

## 2023-06-29 ENCOUNTER — Encounter: Payer: Self-pay | Admitting: Oncology

## 2023-07-06 ENCOUNTER — Inpatient Hospital Stay: Payer: Medicare Other

## 2023-07-07 ENCOUNTER — Inpatient Hospital Stay: Payer: Medicare Other | Admitting: Oncology

## 2023-07-25 ENCOUNTER — Other Ambulatory Visit: Payer: Self-pay | Admitting: Cardiology

## 2023-07-26 ENCOUNTER — Inpatient Hospital Stay: Payer: Medicare Other | Attending: Oncology

## 2023-07-26 ENCOUNTER — Other Ambulatory Visit: Payer: Self-pay

## 2023-07-26 LAB — CMP (CANCER CENTER ONLY)
ALT: 15 U/L (ref 0–44)
AST: 20 U/L (ref 15–41)
Albumin: 4.3 g/dL (ref 3.5–5.0)
Alkaline Phosphatase: 118 U/L (ref 38–126)
Anion gap: 12 (ref 5–15)
BUN: 12 mg/dL (ref 8–23)
CO2: 22 mmol/L (ref 22–32)
Calcium: 9.9 mg/dL (ref 8.9–10.3)
Chloride: 107 mmol/L (ref 98–111)
Creatinine: 0.92 mg/dL (ref 0.44–1.00)
GFR, Estimated: >60 mL/min (ref >60)
Glucose, Bld: 110 mg/dL — ABNORMAL HIGH (ref 70–99)
Potassium: 4.5 mmol/L (ref 3.5–5.1)
Sodium: 141 mmol/L (ref 135–145)
Total Bilirubin: 0.5 mg/dL (ref 0.0–1.2)
Total Protein: 6.8 g/dL (ref 6.5–8.1)

## 2023-07-26 LAB — CBC WITH DIFFERENTIAL (CANCER CENTER ONLY)
Abs Immature Granulocytes: 0.01 10*3/uL (ref 0.00–0.07)
Basophils Absolute: 0.1 10*3/uL (ref 0.0–0.1)
Basophils Relative: 1 %
Eosinophils Absolute: 0.2 10*3/uL (ref 0.0–0.5)
Eosinophils Relative: 3 %
HCT: 39.6 % (ref 36.0–46.0)
Hemoglobin: 13.1 g/dL (ref 12.0–15.0)
Immature Granulocytes: 0 %
Lymphocytes Relative: 30 %
Lymphs Abs: 1.6 10*3/uL (ref 0.7–4.0)
MCH: 26.7 pg (ref 26.0–34.0)
MCHC: 33.1 g/dL (ref 30.0–36.0)
MCV: 80.8 fL (ref 80.0–100.0)
Monocytes Absolute: 0.5 10*3/uL (ref 0.1–1.0)
Monocytes Relative: 9 %
Neutro Abs: 3 10*3/uL (ref 1.7–7.7)
Neutrophils Relative %: 57 %
Platelet Count: 288 10*3/uL (ref 150–400)
RBC: 4.9 MIL/uL (ref 3.87–5.11)
RDW: 14.8 % (ref 11.5–15.5)
WBC Count: 5.3 10*3/uL (ref 4.0–10.5)
nRBC: 0 % (ref 0.0–0.2)
nRBC: 0 /100{WBCs}

## 2023-07-26 LAB — IRON AND TIBC
Iron: 52 ug/dL (ref 28–170)
Saturation Ratios: 14 % (ref 10.4–31.8)
TIBC: 374 ug/dL (ref 250–450)
UIBC: 322 ug/dL

## 2023-07-26 LAB — FERRITIN: Ferritin: 7 ng/mL — ABNORMAL LOW (ref 11–307)

## 2023-07-26 NOTE — Progress Notes (Unsigned)
  [facility]  [address] [place],  [place]  [address] [phone]  Clinic Day:  12/22/2022  Referring physician: Maris Berger, MD  TELEPHONE APPOINTMENT   HISTORY OF PRESENT ILLNESS:  The patient is a 73 y.o. 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  This telephone visit's primary purpose is to go over her recent iron results.  Of note, she has not been phlebotomized in multiple months as her iron parameters have been ideal.  Since her last visit, the patient has been doing okay.  She denies having any new symptoms which concern her for complications related to her underlying hemochromatosis.  PHYSICAL EXAM: DEFERRED   LABS:      Latest Ref Rng & Units 07/26/2023    9:28 AM 12/21/2022    8:23 AM 06/21/2022   10:50 AM  CBC  WBC 4.0 - 10.5 K/uL 5.3  5.2  5.1   Hemoglobin 12.0 - 15.0 g/dL 91.4  78.2  95.6   Hematocrit 36.0 - 46.0 % 39.6  41.7  43.0   Platelets 150 - 400 K/uL 288  277  301       Latest Ref Rng & Units 07/26/2023    9:28 AM 12/21/2022    8:23 AM 12/09/2022    2:54 PM  CMP  Glucose 70 - 99 mg/dL 213  086  578   BUN 8 - 23 mg/dL 12  10  12    Creatinine 0.44 - 1.00 mg/dL 4.69  6.29  5.28   Sodium 135 - 145 mmol/L 141  140  142   Potassium 3.5 - 5.1 mmol/L 4.5  4.3  4.7   Chloride 98 - 111 mmol/L 107  107  106   CO2 22 - 32 mmol/L 22  24  21    Calcium 8.9 - 10.3 mg/dL 9.9  9.4  9.7   Total Protein 6.5 - 8.1 g/dL 6.8  7.2    Total Bilirubin 0.0 - 1.2 mg/dL 0.5  0.8    Alkaline Phos 38 - 126 U/L 118  80    AST 15 - 41 U/L 20  20    ALT 0 - 44 U/L 15  17      Latest Reference Range & Units 07/26/23 09:27  Iron 28 - 170 ug/dL 52  UIBC ug/dL 413  TIBC 244 - 010 ug/dL 272  Saturation Ratios 10.4 - 31.8 % 14  Ferritin 11 - 307 ng/mL 7 (L)  (L): Data is abnormally low  ASSESSMENT & PLAN:  Assessment/Plan:  A 73 y.o. 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  When evaluating her most recent iron parameters, her ferritin  remains well below 50.  Furthermore, her other iron parameters remain at normal levels.  Based upon this, she does not need to be phlebotomized over these next months.  I will see her back in 6 months to reassess her iron parameters.  The patient understands all the plans discussed today and is in agreement with them.    Peaches Vanoverbeke Kirby Funk, MD

## 2023-07-27 ENCOUNTER — Other Ambulatory Visit: Payer: Self-pay | Admitting: Oncology

## 2023-07-27 ENCOUNTER — Inpatient Hospital Stay: Payer: Medicare Other | Admitting: Oncology

## 2023-09-23 ENCOUNTER — Ambulatory Visit: Payer: Medicare Other | Admitting: Gastroenterology

## 2023-11-24 ENCOUNTER — Telehealth: Payer: Self-pay | Admitting: Cardiology

## 2023-11-24 NOTE — Telephone Encounter (Signed)
 Pt c/o swelling/edema: STAT if pt has developed SOB within 24 hours  If swelling, where is the swelling located? Left leg swelling a lot and feet,  it is really red, little swelling in the right leg  How much weight have you gained and in what time span?   Have you gained 2 pounds in a day or 5 pounds in a week?   Do you have a log of your daily weights (if so, list)?   Are you currently taking a fluid pill? yes  Are you currently SOB? yes  Have you traveled recently in a car or plane for an extended period of time? No  Patient saw her Dermatologist today. She was told to see her Cardiologist . Patient wants to be seen asap please.

## 2023-11-24 NOTE — Telephone Encounter (Signed)
 scheduled

## 2023-11-25 ENCOUNTER — Encounter: Payer: Self-pay | Admitting: Cardiology

## 2023-11-25 ENCOUNTER — Encounter: Payer: Self-pay | Admitting: Oncology

## 2023-11-25 ENCOUNTER — Ambulatory Visit: Attending: Cardiology | Admitting: Cardiology

## 2023-11-25 ENCOUNTER — Other Ambulatory Visit: Payer: Self-pay | Admitting: Cardiology

## 2023-11-25 VITALS — BP 120/78 | HR 71 | Ht 67.0 in | Wt 206.8 lb

## 2023-11-25 DIAGNOSIS — G4733 Obstructive sleep apnea (adult) (pediatric): Secondary | ICD-10-CM

## 2023-11-25 DIAGNOSIS — I6930 Unspecified sequelae of cerebral infarction: Secondary | ICD-10-CM

## 2023-11-25 DIAGNOSIS — R0609 Other forms of dyspnea: Secondary | ICD-10-CM

## 2023-11-25 DIAGNOSIS — I10 Essential (primary) hypertension: Secondary | ICD-10-CM

## 2023-11-25 DIAGNOSIS — I5032 Chronic diastolic (congestive) heart failure: Secondary | ICD-10-CM

## 2023-11-25 NOTE — Progress Notes (Unsigned)
 Cardiology Office Note:    Date:  11/25/2023   ID:  General Kenner [place], DOB [birth year], MRN [id number]  PCP:  Ardella Beaver, MD  Cardiologist:  Ralene Burger, MD    Referring MD: Ardella Beaver, MD   Chief Complaint  Patient presents with   Leg Swelling    History of Present Illness:    Wendy Gomez is a 73 y.o. female past medical history significant for essential hypertension hemochromatosis late effect of CVA she was referred back to us  because of swelling of lower extremities she does have diastolic dysfunction based on echocardiogram left leg appears to be more swollen than the right she did have few courses of antibiotic for it she did see a dermatologist who told her this is not cellulitis, she also got DVT study done which showed no evidence of blood clot.  Comes today with complaint of swelling legs worse on the left side than right.  Does have shortness of breath worse than before, no paroxysmal nocturnal dyspnea,  Past Medical History:  Diagnosis Date   Anxiety    Connective tissue disease (HCC)    COPD (chronic obstructive pulmonary disease) (HCC)    Depression    Diverticulosis    hx of    Endometriosis    GERD (gastroesophageal reflux disease)    Hemochromatosis    Hemochromatosis    History of Helicobacter pylori infection    Irritable bowel syndrome    Thoracic stomach hernia     Past Surgical History:  Procedure Laterality Date   ABDOMINAL HYSTERECTOMY     APPENDECTOMY     BREAST ENHANCEMENT SURGERY     CHOLECYSTECTOMY     CHOLECYSTECTOMY     COLONOSCOPY  03/19/2014   Mild sigmoid diverticulosis. External and internal hemorrhoids. Otherwise normal colonoscopy to TI.    ESOPHAGOGASTRODUODENOSCOPY  03/29/2014   Small hiatal hernia. Minimal gastritis.    LAPAROTOMY      Current Medications: Current Meds  Medication Sig   diltiazem  (CARDIZEM  CD) 180 MG 24 hr capsule TAKE 1 CAPSULE BY MOUTH EVERY DAY   ELIQUIS 5 MG TABS tablet  Take 5 mg by mouth 2 (two) times daily.   furosemide  (LASIX ) 20 MG tablet Take 2 tablets (40 mg total) by mouth daily.   meclizine  (ANTIVERT ) 12.5 MG tablet Take 12.5 mg by mouth 3 (three) times daily as needed for dizziness.   metoprolol  tartrate (LOPRESSOR ) 25 MG tablet Take 0.5 tablets (12.5 mg total) by mouth 2 (two) times daily.   Omega-3 Fatty Acids (FISH OIL) 1000 MG CAPS Take 2 capsules by mouth daily.   omeprazole (PRILOSEC) 20 MG capsule Take 40 mg by mouth daily.   Polyethylene Glycol 3350 (MIRALAX PO) Take 1 tablet by mouth daily.   potassium chloride  (KLOR-CON ) 10 MEQ tablet Take 1 tablet (10 mEq total) by mouth daily.     Allergies:   Atorvastatin and Morphine and codeine   Social History   Socioeconomic History   Marital status: Divorced    Spouse name: Not on file   Number of children: Not on file   Years of education: Not on file   Highest education level: Not on file  Occupational History   Not on file  Tobacco Use   Smoking status: Former    Current packs/day: 0.00    Average packs/day: 3.0 packs/day for 26.1 years (78.2 ttl pk-yrs)    Types: Cigarettes    Start date: 9    Quit date: 07/08/2007  Years since quitting: 16.3   Smokeless tobacco: Never  Vaping Use   Vaping status: Never Used  Substance and Sexual Activity   Alcohol use: No   Drug use: No   Sexual activity: Not on file  Other Topics Concern   Not on file  Social History Narrative   Not on file   Social Drivers of Health   Financial Resource Strain: Not on file  Food Insecurity: Not on file  Transportation Needs: Not on file  Physical Activity: Not on file  Stress: Not on file  Social Connections: Not on file     Family History: The patient's family history includes Colon cancer in her father and paternal uncle; Diabetes in her sister; Hemochromatosis in her sister; Hyperlipidemia in her sister; Hypertension in her sister; Kidney cancer in her cousin; Thyroid  cancer in her niece.  There is no history of Rectal cancer, Stomach cancer, or Esophageal cancer. ROS:   Please see the history of present illness.    All 14 point review of systems negative except as described per history of present illness  EKGs/Labs/Other Studies Reviewed:    EKG Interpretation Date/Time:  Friday November 25 2023 10:35:13 EDT Ventricular Rate:  71 PR Interval:  168 QRS Duration:  88 QT Interval:  388 QTC Calculation: 421 R Axis:   -47  Text Interpretation: Normal sinus rhythm Left axis deviation Abnormal ECG Confirmed by Ralene Burger [id number]) on 11/25/2023 10:40:29 AM    Recent Labs: 12/09/2022: NT-Pro BNP 120 07/26/2023: ALT 15; BUN 12; Creatinine 0.92; Hemoglobin 13.1; Platelet Count 288; Potassium 4.5; Sodium 141  Recent Lipid Panel    Component Value Date/Time   CHOL 235 (H) 06/21/2022 1105   TRIG 62 06/21/2022 1105   HDL 57 06/21/2022 1105   CHOLHDL 4.1 06/21/2022 1105   VLDL 12 06/21/2022 1105   LDLCALC 166 (H) 06/21/2022 1105    Physical Exam:    VS:  BP 120/78 (BP Location: Left Arm, Patient Position: Sitting)   Pulse 71   Ht 5' 7 (1.702 m)   Wt 206 lb 12.8 oz (93.8 kg)   SpO2 94%   BMI 32.39 kg/m     Wt Readings from Last 3 Encounters:  11/25/23 206 lb 12.8 oz (93.8 kg)  02/01/23 261 lb (118.4 kg)  12/09/22 267 lb (121.1 kg)     GEN:  Well nourished, well developed in no acute distress HEENT: Normal NECK: No JVD; No carotid bruits LYMPHATICS: No lymphadenopathy CARDIAC: RRR, no murmurs, no rubs, no gallops RESPIRATORY:  Clear to auscultation without rales, wheezing or rhonchi  ABDOMEN: Soft, non-tender, non-distended MUSCULOSKELETAL:  No edema; No deformity  SKIN: Warm and dry LOWER EXTREMITIES: 1+ swelling on the right, 2+ swelling on the left NEUROLOGIC:  Alert and oriented x 3 PSYCHIATRIC:  Normal affect   ASSESSMENT:    1. Essential hypertension   2. Chronic diastolic CHF (congestive heart failure) (HCC)   3. Obstructive sleep apnea   4.  Late effect of cerebrovascular accident (CVA)   5. Hereditary hemochromatosis (HCC)    PLAN:    In order of problems listed above:  Essential hypertension: Blood pressure well-controlled continue present management. Swelling of lower extremities but asymmetrical DVT study negative she is on Eliquis which we will continue.  Will check proBNP Chem-7 I will get echocardiogram to assess left ventricle systolic and systolic function, she will increase dose of Lasix  she takes 40 mg daily right now we will go to 60 mg with doubling potassium.  I see her back in about 3 months Obstructive sleep apnea: That is followed by internal medicine team. History of CVA stable. Hereditary hemochromatosis.  Stable   Medication Adjustments/Labs and Tests Ordered: Current medicines are reviewed at length with the patient today.  Concerns regarding medicines are outlined above.  Orders Placed This Encounter  Procedures   EKG 12-Lead   Medication changes: No orders of the defined types were placed in this encounter.   Signed, Manfred Seed, MD, [facility] 11/25/2023 10:53 AM    [facility]

## 2023-11-25 NOTE — Patient Instructions (Addendum)
 Medication Instructions:   Lasix  20mg  2 tablets in the morning and 1 in the evening over weekend  Potassium 20mEq over weekend   Lab Work: BMP, ProBNP- today If you have labs (blood work) drawn today and your tests are completely normal, you will receive your results only by: MyChart Message (if you have MyChart) OR A paper copy in the mail If you have any lab test that is abnormal or we need to change your treatment, we will call you to review the results.   Testing/Procedures: Your physician has requested that you have an echocardiogram. Echocardiography is a painless test that uses sound waves to create images of your heart. It provides your doctor with information about the size and shape of your heart and how well your heart's chambers and valves are working. This procedure takes approximately one hour. There are no restrictions for this procedure. Please do NOT wear cologne, perfume, aftershave, or lotions (deodorant is allowed). Please arrive 15 minutes prior to your appointment time.  Please note: We ask at that you not bring children with you during ultrasound (echo/ vascular) testing. Due to room size and safety concerns, children are not allowed in the ultrasound rooms during exams. Our front office staff cannot provide observation of children in our lobby area while testing is being conducted. An adult accompanying a patient to their appointment will only be allowed in the ultrasound room at the discretion of the ultrasound technician under special circumstances. We apologize for any inconvenience.    Follow-Up: At [facility], you and your health needs are our priority.  As part of our continuing mission to provide you with exceptional heart care, we have created designated Provider Care Teams.  These Care Teams include your primary Cardiologist (physician) and Advanced Practice Providers (APPs -  Physician Assistants and Nurse Practitioners) who all work together to provide  you with the care you need, when you need it.  We recommend signing up for the patient portal called MyChart.  Sign up information is provided on this After Visit Summary.  MyChart is used to connect with patients for Virtual Visits (Telemedicine).  Patients are able to view lab/test results, encounter notes, upcoming appointments, etc.  Non-urgent messages can be sent to your provider as well.   To learn more about what you can do with MyChart, go to [email or web address].    Your next appointment:   3 month(s)  The format for your next appointment:   In Person  Provider:   Ralene Burger, MD    Other Instructions NA

## 2023-11-26 LAB — BASIC METABOLIC PANEL WITH GFR
BUN/Creatinine Ratio: 12 (ref 12–28)
BUN: 11 mg/dL (ref 8–27)
CO2: 19 mmol/L — ABNORMAL LOW (ref 20–29)
Calcium: 9.6 mg/dL (ref 8.7–10.3)
Chloride: 104 mmol/L (ref 96–106)
Creatinine, Ser: 0.93 mg/dL (ref 0.57–1.00)
Glucose: 104 mg/dL — ABNORMAL HIGH (ref 70–99)
Potassium: 4.2 mmol/L (ref 3.5–5.2)
Sodium: 141 mmol/L (ref 134–144)
eGFR: 65 mL/min/{1.73_m2} (ref >59)

## 2023-11-26 LAB — PRO B NATRIURETIC PEPTIDE: NT-Pro BNP: 144 pg/mL (ref 0–301)

## 2023-11-28 ENCOUNTER — Other Ambulatory Visit: Payer: Self-pay | Admitting: Nurse Practitioner

## 2023-11-28 DIAGNOSIS — J4540 Moderate persistent asthma, uncomplicated: Secondary | ICD-10-CM

## 2023-12-04 ENCOUNTER — Ambulatory Visit: Payer: Self-pay | Admitting: Cardiology

## 2023-12-05 ENCOUNTER — Telehealth: Payer: Self-pay

## 2023-12-05 NOTE — Telephone Encounter (Signed)
 Left message on My Chart with lab results per Dr. Vanetta Shawl note. Routed to PCP.

## 2023-12-06 ENCOUNTER — Telehealth: Payer: Self-pay

## 2023-12-06 NOTE — Telephone Encounter (Signed)
 Pt viewed lab results on My Chart per Dr. Vanetta Shawl note. Routed to PCP.

## 2023-12-14 ENCOUNTER — Ambulatory Visit: Attending: Cardiology

## 2023-12-14 DIAGNOSIS — R0609 Other forms of dyspnea: Secondary | ICD-10-CM | POA: Diagnosis not present

## 2023-12-15 LAB — ECHOCARDIOGRAM COMPLETE
AR max vel: 2.01 cm2
AV Area VTI: 1.98 cm2
AV Area mean vel: 2.01 cm2
AV Mean grad: 3 mmHg
AV Peak grad: 5 mmHg
Ao pk vel: 1.12 m/s
Area-P 1/2: 3.72 cm2
MV VTI: 1.3 cm2
S' Lateral: 3.4 cm

## 2023-12-19 ENCOUNTER — Telehealth: Payer: Self-pay

## 2023-12-19 NOTE — Telephone Encounter (Signed)
 Pt viewed Echo results on My Chart per Dr. Vanetta Shawl note. Routed to PCP.

## 2023-12-19 NOTE — Telephone Encounter (Signed)
 Left message on My Chart with Echo results per Dr. Vanetta Shawl note. Routed to PCP.

## 2023-12-21 ENCOUNTER — Other Ambulatory Visit (INDEPENDENT_AMBULATORY_CARE_PROVIDER_SITE_OTHER)

## 2023-12-21 ENCOUNTER — Ambulatory Visit: Payer: Self-pay | Admitting: Gastroenterology

## 2023-12-21 ENCOUNTER — Encounter: Payer: Self-pay | Admitting: Oncology

## 2023-12-21 ENCOUNTER — Encounter: Payer: Self-pay | Admitting: Gastroenterology

## 2023-12-21 ENCOUNTER — Ambulatory Visit: Admitting: Gastroenterology

## 2023-12-21 VITALS — BP 130/70 | HR 76 | Ht 65.5 in | Wt 260.1 lb

## 2023-12-21 DIAGNOSIS — K5909 Other constipation: Secondary | ICD-10-CM

## 2023-12-21 DIAGNOSIS — R1031 Right lower quadrant pain: Secondary | ICD-10-CM

## 2023-12-21 DIAGNOSIS — Z800 Family history of malignant neoplasm of digestive organs: Secondary | ICD-10-CM | POA: Diagnosis not present

## 2023-12-21 DIAGNOSIS — K449 Diaphragmatic hernia without obstruction or gangrene: Secondary | ICD-10-CM

## 2023-12-21 DIAGNOSIS — K219 Gastro-esophageal reflux disease without esophagitis: Secondary | ICD-10-CM

## 2023-12-21 DIAGNOSIS — Z83719 Family history of colon polyps, unspecified: Secondary | ICD-10-CM

## 2023-12-21 DIAGNOSIS — R1310 Dysphagia, unspecified: Secondary | ICD-10-CM

## 2023-12-21 LAB — CBC WITH DIFFERENTIAL/PLATELET
Basophils Absolute: 0.1 K/uL (ref 0.0–0.1)
Basophils Relative: 1 % (ref 0.0–3.0)
Eosinophils Absolute: 0.2 K/uL (ref 0.0–0.7)
Eosinophils Relative: 4 % (ref 0.0–5.0)
HCT: 40.9 % (ref 36.0–46.0)
Hemoglobin: 13.2 g/dL (ref 12.0–15.0)
Lymphocytes Relative: 30.4 % (ref 12.0–46.0)
Lymphs Abs: 1.6 K/uL (ref 0.7–4.0)
MCHC: 32.4 g/dL (ref 30.0–36.0)
MCV: 80.8 fl (ref 78.0–100.0)
Monocytes Absolute: 0.5 K/uL (ref 0.1–1.0)
Monocytes Relative: 9.7 % (ref 3.0–12.0)
Neutro Abs: 2.9 K/uL (ref 1.4–7.7)
Neutrophils Relative %: 54.9 % (ref 43.0–77.0)
Platelets: 284 K/uL (ref 150.0–400.0)
RBC: 5.06 Mil/uL (ref 3.87–5.11)
RDW: 15.5 % (ref 11.5–15.5)
WBC: 5.3 K/uL (ref 4.0–10.5)

## 2023-12-21 LAB — COMPREHENSIVE METABOLIC PANEL WITH GFR
ALT: 16 U/L (ref 0–35)
AST: 19 U/L (ref 0–37)
Albumin: 4.6 g/dL (ref 3.5–5.2)
Alkaline Phosphatase: 96 U/L (ref 39–117)
BUN: 13 mg/dL (ref 6–23)
CO2: 28 meq/L (ref 19–32)
Calcium: 9.8 mg/dL (ref 8.4–10.5)
Chloride: 104 meq/L (ref 96–112)
Creatinine, Ser: 0.87 mg/dL (ref 0.40–1.20)
GFR: 66.65 mL/min (ref >60.00)
Glucose, Bld: 109 mg/dL — ABNORMAL HIGH (ref 70–99)
Potassium: 4 meq/L (ref 3.5–5.1)
Sodium: 140 meq/L (ref 135–145)
Total Bilirubin: 0.8 mg/dL (ref 0.2–1.2)
Total Protein: 7.2 g/dL (ref 6.0–8.3)

## 2023-12-21 NOTE — Progress Notes (Signed)
 Chief Complaint: Abdo pain  Referring Provider:  Marelyn Quill, MD      ASSESSMENT AND PLAN;   #1. RLQ Pain.  #2. Chronic constipation. #3. GERD with small HH. Now with occ dysphagia #4. FH CRC (dad at age 72), FH of colonic polyps (mom at age 91). #5. 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  Being followed by Dr. Ezzard.  Stable.  Plan: - Check CBC, CMP, AFP - CT abdo/pel with p.o. and IV contrast  - EGD with dil/colon with miralax after holding Eliquis x 2 days and cardiology clearance - Continue miralax 17g po qd. - Continue omeprazole - FU thereafter.  HPI:    Wendy Gomez is a 73 y.o. 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  She experiences severe right lower abdominal pain radiating to her back, particularly when she needs to urinate. The pain is described as severe, requiring her to 'make herself get up' to go to the bathroom, where she leaks urine on the way. Sitting down relieves some of the pain, which lasts about a minute. She relates this pain to previous experiences following gallbladder surgery, where she had complications including a leak.  She has a history of constipation, which is managed with Miralax. She experiences constipation if she does not take Miralax regularly. No diarrhea is reported.  She takes omeprazole for reflux and has a history of a hiatal hernia. Occasionally, she experiences difficulty swallowing, even with water, which she attributes to weight gain.  She has hemochromatosis and undergoes phlebotomy every six months. Her last blood draw was over a year  ago, but her ferritin and iron saturation levels have been stable.  She had a stroke nearly three years ago and is on Eliquis for stroke prevention. She does not have a neurologist but her Eliquis is managed by Doctor Cystasis.  She reports that she has congestive heart failure, specifically diastolic failure, and she experiences leg swelling. She has had an echocardiogram recently, which showed an ejection fraction of 55-60%.  She has a history of multiple surgeries for scar tissue removal, which she relates to her current abdominal pain. She also experiences back pain, which she attributes to arthritis.  There is no melena or hematochezia. No unintentional weight loss.  Convinced that her pain is due to pelvic adhesions.  No nonsteroidals.   Wt Readings from Last 3 Encounters:  12/21/23 260 lb 2 oz (118 kg)  11/25/23 206 lb 12.8 oz (93.8 kg)  02/01/23 261 lb (118.4 kg)     Used to work in [employer] retired.  Additional past surgical history: -H/O endometriosis, total hysterectomy -Lap chole (Dr Bert) complicated by postop leak requiring ERCP (Dr Charlanne)  Past GI procedures: -EGD 03/29/2014: Small hiatal hernia, mild gastritis. CLO- neg -Colonoscopy 03/29/2014 (adult, some difficulty): Mild sigmoid diverticulosis, small internal hemorrhoids  Colon: 12/2018 - Colonic polyps status post polypectomy. - Mild sigmoid diverticulosis. - Otherwise normal colonoscopy to TI. - Bx; hyperplastic - Rpt 5 yrs d/t FH  -CT AP 2020 IMPRESSION: 1. No acute findings within the abdomen or pelvis. 2. Small paraumbilical ventral hernia containing only fat. Aortic Atherosclerosis (ICD10-I70.0).  2DE 12/14/2023: EF  55 to 60%. Past Medical History:  Diagnosis Date   Anxiety    Connective tissue disease (HCC)    COPD (chronic obstructive pulmonary disease) (HCC)    Depression    Diverticulosis    hx of    Endometriosis    GERD (gastroesophageal reflux disease)    Hemochromatosis     Hemochromatosis    History of Helicobacter pylori infection    Irritable bowel syndrome    Stroke [facility])    Thoracic stomach hernia     Past Surgical History:  Procedure Laterality Date   ABDOMINAL HYSTERECTOMY     APPENDECTOMY     BREAST ENHANCEMENT SURGERY     CHOLECYSTECTOMY     CHOLECYSTECTOMY     COLONOSCOPY  03/19/2014   Mild sigmoid diverticulosis. External and internal hemorrhoids. Otherwise normal colonoscopy to TI.    ESOPHAGOGASTRODUODENOSCOPY  03/29/2014   Small hiatal hernia. Minimal gastritis.    KNEE ARTHROSCOPY Right    LAPAROTOMY      Family History  Problem Relation Age of Onset   Prostate cancer Father    Brain cancer Father    Hypertension Sister    Diabetes Sister    Heart disease Sister    Heart disease Sister    Thyroid  cancer Sister    Hyperlipidemia Sister    Hemachromatosis Sister    Lung disease Sister    Kidney cancer Cousin    Thyroid  cancer Niece    Hypertension Son    Rectal cancer Neg Hx    Stomach cancer Neg Hx    Esophageal cancer Neg Hx     Social History   Tobacco Use   Smoking status: Former    Current packs/day: 0.00    Average packs/day: 3.0 packs/day for 26.1 years (78.2 ttl pk-yrs)    Types: Cigarettes    Start date: 83    Quit date: 07/08/2007    Years since quitting: 16.4   Smokeless tobacco: Never  Vaping Use   Vaping status: Never Used  Substance Use Topics   Alcohol use: No   Drug use: No    Current Outpatient Medications  Medication Sig Dispense Refill   albuterol (PROVENTIL HFA;VENTOLIN HFA) 108 (90 BASE) MCG/ACT inhaler Inhale 2 puffs into the lungs every 6 (six) hours as needed for wheezing or shortness of breath.     ammonium lactate (LAC-HYDRIN) 12 % lotion Apply 1 Application topically as needed.     cholecalciferol (VITAMIN D3) 25 MCG (1000 UT) tablet Take 2,000 Units by mouth daily.     clotrimazole -betamethasone  (LOTRISONE ) cream Apply 1 Application topically daily. Apply to bottoms of heels daily  45 g 3   diltiazem  (CARDIZEM  CD) 180 MG 24 hr capsule TAKE 1 CAPSULE BY MOUTH EVERY DAY 30 capsule 6   ELIQUIS 5 MG TABS tablet Take 5 mg by mouth 2 (two) times daily.     fluticasone  (FLONASE) 50 MCG/ACT nasal spray Place 2 sprays into both nostrils as needed for allergies.     furosemide  (LASIX ) 20 MG tablet Take 2 tablets (40 mg total) by mouth daily. 180 tablet 3   meclizine  (ANTIVERT ) 25 MG tablet Take 25 mg by mouth 3 (three) times daily as needed.     metoprolol  tartrate (LOPRESSOR ) 25 MG tablet Take 0.5 tablets (12.5 mg total) by mouth 2 (two) times daily. 60 tablet 1   Omega-3 Fatty Acids (FISH OIL) 1000 MG CAPS Take 2 capsules by mouth daily.     omeprazole (PRILOSEC) 20 MG capsule  Take 40 mg by mouth daily.     ondansetron (ZOFRAN) 4 MG tablet Take 8 mg by mouth every 6 (six) hours as needed for nausea or vomiting.     Polyethylene Glycol 3350 (MIRALAX PO) Take 17 g by mouth daily.     potassium chloride  (KLOR-CON ) 10 MEQ tablet TAKE 1 TABLET BY MOUTH EVERY DAY 90 tablet 3   WIXELA INHUB 100-50 MCG/ACT AEPB INHALE 1 PUFF INTO THE LUNGS TWICE A DAY 60 each 5   No current facility-administered medications for this visit.    Allergies  Allergen Reactions   Lipitor [Atorvastatin] Other (See Comments)   Morphine And Codeine Nausea And Vomiting    Review of Systems:  Constitutional: Denies fever, chills, diaphoresis, appetite change and has fatigue.  HEENT: Denies photophobia, eye pain, redness, hearing loss, ear pain, congestion, sore throat, rhinorrhea, sneezing, mouth sores, neck pain, neck stiffness and tinnitus.   Respiratory: Denies SOB, DOE, cough, chest tightness,  and wheezing.   Cardiovascular: Denies chest pain, palpitations and leg swelling.  Genitourinary: Denies dysuria, urgency, frequency, hematuria, flank pain and difficulty urinating.  Musculoskeletal: Has myalgias, back pain, joint swelling, arthralgias and gait problem.  Skin: No rash.  Neurological: Denies  dizziness, seizures, syncope, weakness, light-headedness, numbness and headaches.  Hematological: Denies adenopathy. Easy bruising, personal or family bleeding history  Psychiatric/Behavioral: Has anxiety or depression     Physical Exam:    BP 130/70 (BP Location: Left Arm, Patient Position: Sitting, Cuff Size: Large)   Pulse 76   Ht 5' 5.5 (1.664 m) Comment: height measured without shoes  Wt 260 lb 2 oz (118 kg)   BMI 42.63 kg/m  Filed Weights   12/21/23 1024  Weight: 260 lb 2 oz (118 kg)   Gen: awake, alert, NAD HEENT: anicteric, no pallor CV: RRR, no mrg Pulm: CTA b/l Abd: soft, NT/ND, +BS throughout Ext: no c/c/e Neuro: nonfocal    Anselm Bring, MD 12/21/2023, 10:50 AM  Cc: Marelyn Quill, MD

## 2023-12-21 NOTE — Patient Instructions (Addendum)
 _______________________________________________________  If your blood pressure at your visit was 140/90 or greater, please contact your primary care physician to follow up on this.  _______________________________________________________  If you are age 72 or older, your body mass index should be between 23-30. Your Body mass index is 42.63 kg/m. If this is out of the aforementioned range listed, please consider follow up with your Primary Care Provider.  If you are age 41 or younger, your body mass index should be between 19-25. Your Body mass index is 42.63 kg/m. If this is out of the aformentioned range listed, please consider follow up with your Primary Care Provider.   ________________________________________________________  The [facility] providers would like to encourage you to use MYCHART to communicate with providers for non-urgent requests or questions.  Due to long hold times on the telephone, sending your provider a message by [facility] may be a faster and more efficient way to get a response.  Please allow 48 business hours for a response.  Please remember that this is for non-urgent requests.  _______________________________________________________  Your provider has requested that you go to the basement level for lab work before leaving today. Press B on the elevator. The lab is located at the first door on the left as you exit the elevator.  Please purchase the following medications over the counter and take as directed: Miralax 17g daily  Continue omeprazole  You have been scheduled for a CT scan of the abdomen and pelvis at [facility] (Address [address] [place], [place], [place] [address].   You are scheduled on 01-05-24 at 12pm . You should arrive by 10am your appointment time for registration. Please follow the written instructions below on the day of your exam:  WARNING: IF YOU ARE ALLERGIC TO IODINE/X-RAY DYE, PLEASE NOTIFY RADIOLOGY IMMEDIATELY AT  [phone]! YOU WILL BE GIVEN A 13 HOUR PREMEDICATION PREP.  1) Light foods and drinks  after 8am (4 hours prior to your test) 2) You will be given 2 bottles of oral contrast to drink on site.    Drink 1 bottle of contrast @  10am (2 hours prior to your exam)  Drink 1 bottle of contrast @ 11am (1 hour prior to your exam)  You may take any medications as prescribed with a small amount of water, if necessary. If you take any of the following medications: METFORMIN, GLUCOPHAGE, GLUCOVANCE, AVANDAMET, RIOMET, FORTAMET, ACTOPLUS MET, JANUMET, GLUMETZA or METAGLIP, you MAY be asked to HOLD this medication 48 hours AFTER the exam.  The purpose of you drinking the oral contrast is to aid in the visualization of your intestinal tract. The contrast solution may cause some diarrhea. Depending on your individual set of symptoms, you may also receive an intravenous injection of x-ray contrast/dye. Plan on being at [facility] for 30 minutes or longer, depending on the type of exam you are having performed.  This test typically takes 30-45 minutes to complete.  If you have any questions regarding your exam or if you need to reschedule, you may call the CT department at [phone] between the hours of 8:00 am and 5:00 pm, Monday-Friday. Contrast is only on Thursday. Without contrast is everyday.   You will be contacted by our office prior to your procedure for directions on holding your blood thinner.  If you do not hear from our office 2 week prior to your scheduled procedure, please call [phone] to discuss.   You have been scheduled for an endoscopy and colonoscopy.  Please follow the written instructions given to you at your visit today.  If you use inhalers (even only as needed), please bring them with you on the day of your procedure.  DO NOT TAKE 7 DAYS PRIOR TO TEST- Trulicity (dulaglutide) Ozempic, Wegovy (semaglutide) Mounjaro (tirzepatide) Bydureon Bcise (exanatide extended  release)  DO NOT TAKE 1 DAY PRIOR TO YOUR TEST Rybelsus (semaglutide) Adlyxin (lixisenatide) Victoza (liraglutide) Byetta (exanatide) ___________________________________________________________________________  Thank you,  Dr. Lynnie Bring

## 2023-12-22 LAB — AFP TUMOR MARKER: AFP-Tumor Marker: 6.4 ng/mL — ABNORMAL HIGH

## 2023-12-24 NOTE — Progress Notes (Signed)
 Please inform the patient. AFP 6.4 Proceed with CT Abdo/pelvis as scheduled Repeat AFP in 3 months. If CT is abnormal, then would consider MRI liver. Send report to family physician

## 2023-12-26 ENCOUNTER — Other Ambulatory Visit: Payer: Self-pay

## 2023-12-26 DIAGNOSIS — Z83719 Family history of colon polyps, unspecified: Secondary | ICD-10-CM

## 2023-12-26 DIAGNOSIS — R1031 Right lower quadrant pain: Secondary | ICD-10-CM

## 2023-12-26 DIAGNOSIS — K449 Diaphragmatic hernia without obstruction or gangrene: Secondary | ICD-10-CM

## 2023-12-26 DIAGNOSIS — R1310 Dysphagia, unspecified: Secondary | ICD-10-CM

## 2023-12-26 DIAGNOSIS — K5909 Other constipation: Secondary | ICD-10-CM

## 2023-12-26 DIAGNOSIS — Z800 Family history of malignant neoplasm of digestive organs: Secondary | ICD-10-CM

## 2024-01-05 ENCOUNTER — Encounter: Payer: Self-pay | Admitting: Oncology

## 2024-01-05 ENCOUNTER — Other Ambulatory Visit (HOSPITAL_BASED_OUTPATIENT_CLINIC_OR_DEPARTMENT_OTHER): Admitting: Radiology

## 2024-01-12 ENCOUNTER — Ambulatory Visit (HOSPITAL_BASED_OUTPATIENT_CLINIC_OR_DEPARTMENT_OTHER)
Admission: RE | Admit: 2024-01-12 | Discharge: 2024-01-12 | Disposition: A | Discharge status: Home / Self Care | Source: Ambulatory Visit | Attending: Gastroenterology | Admitting: Gastroenterology

## 2024-01-12 DIAGNOSIS — K219 Gastro-esophageal reflux disease without esophagitis: Secondary | ICD-10-CM | POA: Diagnosis not present

## 2024-01-12 DIAGNOSIS — R1031 Right lower quadrant pain: Secondary | ICD-10-CM

## 2024-01-12 DIAGNOSIS — K449 Diaphragmatic hernia without obstruction or gangrene: Secondary | ICD-10-CM

## 2024-01-12 DIAGNOSIS — K5900 Constipation, unspecified: Secondary | ICD-10-CM | POA: Diagnosis not present

## 2024-01-12 DIAGNOSIS — K5909 Other constipation: Secondary | ICD-10-CM

## 2024-01-12 MED ORDER — IOHEXOL 300 MG/ML  SOLN
100.0000 mL | Freq: Once | INTRAMUSCULAR | Status: AC | PRN
  Administered 2024-01-12: 100 mL via INTRAVENOUS

## 2024-01-20 ENCOUNTER — Inpatient Hospital Stay: Attending: Oncology

## 2024-01-20 DIAGNOSIS — Z79899 Other long term (current) drug therapy: Secondary | ICD-10-CM | POA: Insufficient documentation

## 2024-01-20 LAB — IRON AND TIBC
Iron: 76 ug/dL (ref 28–170)
Saturation Ratios: 19 % (ref 10.4–31.8)
TIBC: 410 ug/dL (ref 250–450)
UIBC: 334 ug/dL

## 2024-01-20 LAB — FERRITIN: Ferritin: 9 ng/mL — ABNORMAL LOW (ref 11–307)

## 2024-01-20 LAB — CBC WITH DIFFERENTIAL (CANCER CENTER ONLY)
Abs Immature Granulocytes: 0.01 K/uL (ref 0.00–0.07)
Basophils Absolute: 0.1 K/uL (ref 0.0–0.1)
Basophils Relative: 1 %
Eosinophils Absolute: 0.2 K/uL (ref 0.0–0.5)
Eosinophils Relative: 3 %
HCT: 40.9 % (ref 36.0–46.0)
Hemoglobin: 13.1 g/dL (ref 12.0–15.0)
Immature Granulocytes: 0 %
Lymphocytes Relative: 24 %
Lymphs Abs: 1.4 K/uL (ref 0.7–4.0)
MCH: 26.3 pg (ref 26.0–34.0)
MCHC: 32 g/dL (ref 30.0–36.0)
MCV: 82 fL (ref 80.0–100.0)
Monocytes Absolute: 0.4 K/uL (ref 0.1–1.0)
Monocytes Relative: 8 %
Neutro Abs: 3.7 K/uL (ref 1.7–7.7)
Neutrophils Relative %: 64 %
Platelet Count: 283 K/uL (ref 150–400)
RBC: 4.99 MIL/uL (ref 3.87–5.11)
RDW: 14.5 % (ref 11.5–15.5)
WBC Count: 5.7 K/uL (ref 4.0–10.5)
nRBC: 0 % (ref 0.0–0.2)

## 2024-01-20 LAB — CMP (CANCER CENTER ONLY)
ALT: 11 U/L (ref 0–44)
AST: 19 U/L (ref 15–41)
Albumin: 4.7 g/dL (ref 3.5–5.0)
Alkaline Phosphatase: 100 U/L (ref 38–126)
Anion gap: 11 (ref 5–15)
BUN: 14 mg/dL (ref 8–23)
CO2: 23 mmol/L (ref 22–32)
Calcium: 9.9 mg/dL (ref 8.9–10.3)
Chloride: 104 mmol/L (ref 98–111)
Creatinine: 0.89 mg/dL (ref 0.44–1.00)
GFR, Estimated: >60 mL/min (ref >60)
Glucose, Bld: 105 mg/dL — ABNORMAL HIGH (ref 70–99)
Potassium: 4.4 mmol/L (ref 3.5–5.1)
Sodium: 138 mmol/L (ref 135–145)
Total Bilirubin: 0.8 mg/dL (ref 0.0–1.2)
Total Protein: 7.1 g/dL (ref 6.5–8.1)

## 2024-01-22 NOTE — Progress Notes (Signed)
  [facility] at [facility] [address] [place],  [place]  [address] [phone]  Clinic Day:  01/23/2024  Referring physician: Marelyn Quill, MD  TELEPHONE APPOINTMENT   HISTORY OF PRESENT ILLNESS:  The patient is a 73 y.o. 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  This telephone visit's primary purpose is to go over her recent iron results.  Of note, she has not been phlebotomized in months as her iron parameters have been ideal.  Since her last visit, the patient has been doing okay.  She denies having any new symptoms which concern her for complications related to her underlying hemochromatosis.  PHYSICAL EXAM: DEFERRED   LABS:      Latest Ref Rng & Units 01/20/2024    9:20 AM 12/21/2023   11:49 AM 07/26/2023    9:28 AM  CBC  WBC 4.0 - 10.5 K/uL 5.7  5.3  5.3   Hemoglobin 12.0 - 15.0 g/dL 86.8  86.7  86.8   Hematocrit 36.0 - 46.0 % 40.9  40.9  39.6   Platelets 150 - 400 K/uL 283  284.0  288       Latest Ref Rng & Units 01/20/2024    9:20 AM 12/21/2023   11:49 AM 11/25/2023   11:18 AM  CMP  Glucose 70 - 99 mg/dL 894  890  895   BUN 8 - 23 mg/dL 14  13  11    Creatinine 0.44 - 1.00 mg/dL 9.10  9.12  9.06   Sodium 135 - 145 mmol/L 138  140  141   Potassium 3.5 - 5.1 mmol/L 4.4  4.0  4.2   Chloride 98 - 111 mmol/L 104  104  104   CO2 22 - 32 mmol/L 23  28  19    Calcium 8.9 - 10.3 mg/dL 9.9  9.8  9.6   Total Protein 6.5 - 8.1 g/dL 7.1  7.2    Total Bilirubin 0.0 - 1.2 mg/dL 0.8  0.8    Alkaline Phos 38 - 126 U/L 100  96    AST 15 - 41 U/L 19  19    ALT 0 - 44 U/L 11  16      Latest Reference Range & Units 01/20/24 09:19  Iron 28 - 170 ug/dL 76  UIBC ug/dL 665  TIBC 749 - 549 ug/dL 589  Saturation Ratios 10.4 - 31.8 % 19  Ferritin 11 - 307 ng/mL 9 (L)  (L): Data is abnormally low  ASSESSMENT & PLAN:  Assessment/Plan:  A 73 y.o. 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  When evaluating her most recent iron parameters, her ferritin remains well  below 50.  Furthermore, her other iron parameters remain at normal levels.  Based upon this, she does not need to be phlebotomized over these next months.  I will see her back in 6 months to reassess her iron parameters.  The patient understands all the plans discussed today and is in agreement with them.    Atheena Spano DELENA Kerns, MD

## 2024-01-23 ENCOUNTER — Inpatient Hospital Stay: Admitting: Oncology

## 2024-01-23 ENCOUNTER — Telehealth: Payer: Self-pay | Admitting: Oncology

## 2024-01-23 ENCOUNTER — Other Ambulatory Visit: Payer: Medicare Other

## 2024-01-23 NOTE — Telephone Encounter (Signed)
 Patient has been scheduled for follow-up visit per 01/23/24 LOS.  Pt noted appt details on personal planner/calendar.

## 2024-01-24 ENCOUNTER — Telehealth: Payer: Medicare Other | Admitting: Oncology

## 2024-01-30 ENCOUNTER — Telehealth: Payer: Self-pay

## 2024-01-30 NOTE — Telephone Encounter (Signed)
 I spoke to Gore that she can use any size Gatorade as long as it adds up to the correct amount.

## 2024-01-30 NOTE — Telephone Encounter (Signed)
 Advised patient that she can use any size that will total to the amount she needs.

## 2024-01-31 ENCOUNTER — Other Ambulatory Visit: Payer: Self-pay | Admitting: Cardiology

## 2024-02-17 ENCOUNTER — Telehealth: Payer: Self-pay

## 2024-02-17 ENCOUNTER — Telehealth: Payer: Self-pay | Admitting: Gastroenterology

## 2024-02-17 NOTE — Telephone Encounter (Signed)
 I assumed that her PCP managed her Eliquis since I don't see a script written by cardiology but if yall want to be the ones to give to give the ok for the patient to hold it 2 days prior to procedure that would be great as well

## 2024-02-17 NOTE — Telephone Encounter (Signed)
 [facility] Pre-operative Risk Assessment     Request for surgical clearance:     Endoscopy Procedure  What type of surgery is being performed? Colon/EGD  When is this surgery scheduled?     02-29-24  What type of clearance is required ?   Medical  Are there any medications that need to be held prior to surgery and how long?   Practice name and name of physician performing surgery?      [facility]  What is your office phone and fax number?      Phone- [phone]  Fax- [phone]  Anesthesia type (None, local, MAC, general) ?       MAC   Please route your response to [facility])

## 2024-02-17 NOTE — Telephone Encounter (Signed)
 I will update all parties involved pt has appt 02/27/24 with Dr. Krasowski for preop clearance and f/u appt. See notes about blood thinner managed by PCP per GI office.

## 2024-02-17 NOTE — Telephone Encounter (Signed)
   Name: Wendy Gomez [facility]  DOB: 12-03-1950  MRN: [id number]  Primary Cardiologist: None  Chart reviewed as part of pre-operative protocol coverage. The patient has an upcoming visit scheduled with Dr. Krasowski on 02/27/2024 at which time clearance can be addressed in case there are any issues that would impact surgical recommendations.  Colonoscopy and EDG Is not scheduled until 02/29/2024 as below. I added preop FYI to appointment note so that provider is aware to address at time of outpatient visit.  Per office protocol the cardiology provider should forward their finalized clearance decision and recommendations regarding antiplatelet therapy to the requesting party below.    This message will also be routed to pharmacy pool and/or Dr Bernie  for input on holding Eliquis as requested below so that this information is available to the clearing provider at time of patient's appointment.   I will route this message as FYI to requesting party and remove this message from the preop box as separate preop APP input not needed at this time.   Please call with any questions.  Lamarr Satterfield, NP  02/17/2024, 2:49 PM

## 2024-02-17 NOTE — Telephone Encounter (Signed)
   Name: Wendy Gomez [facility]  DOB: September 10, 1950  MRN: [id number]  Primary Cardiologist: None   Preoperative team, please contact this patient and set up a phone call appointment for further preoperative risk assessment. Please obtain consent and complete medication review. Thank you for your help.  I confirm that guidance regarding antiplatelet and oral anticoagulation therapy has been completed and, if necessary, noted below.  PCP to make recommendations on holding Eliquis.    I also confirmed the patient resides in the state of [place] . As per [facility] Medical Board telemedicine laws, the patient must reside in the state in which the provider is licensed.   Lamarr Satterfield, NP 02/17/2024, 2:14 PM [facility]

## 2024-02-17 NOTE — Telephone Encounter (Signed)
 Received a call from patient stating she would be contacted to come off her Eloquis before procedure, it has passed that and she is requesting nurse fu call to discuss. Please review and advise  Thank you

## 2024-02-17 NOTE — Telephone Encounter (Signed)
 Patient is on Eliquis. Please confirm with [facility] if they want this held for colonoscopy and EDG. They usually do.

## 2024-02-20 ENCOUNTER — Other Ambulatory Visit: Payer: Self-pay | Admitting: Cardiology

## 2024-02-20 ENCOUNTER — Encounter: Payer: Self-pay | Admitting: Cardiology

## 2024-02-20 NOTE — Telephone Encounter (Addendum)
 Patient has been informed that she can hold her Eliquis 2 days prior to her procedure by Dr Marelyn and she will keep her appointment with cardiology to make sure she can have her procedure   Dr Marelyn said ok to stop Eliquis 2 days prior to procedure, may resume the next day

## 2024-02-20 NOTE — Telephone Encounter (Signed)
 Patient has been informed that she can hold her Eliquis 2 days prior to her procedure by Dr Marelyn and she will keep her appointment with cardiology to make sure she can have her procedure     Dr Marelyn said ok to stop Eliquis 2 days prior to procedure, may resume the next day  See cardiology on 02-27-24

## 2024-02-23 ENCOUNTER — Encounter: Payer: Self-pay | Admitting: Gastroenterology

## 2024-02-27 ENCOUNTER — Ambulatory Visit: Attending: Cardiology | Admitting: Cardiology

## 2024-02-27 ENCOUNTER — Encounter: Payer: Self-pay | Admitting: Cardiology

## 2024-02-27 VITALS — BP 130/76 | HR 67 | Ht 65.5 in | Wt 260.0 lb

## 2024-02-27 DIAGNOSIS — Z87891 Personal history of nicotine dependence: Secondary | ICD-10-CM

## 2024-02-27 DIAGNOSIS — E785 Hyperlipidemia, unspecified: Secondary | ICD-10-CM | POA: Insufficient documentation

## 2024-02-27 DIAGNOSIS — I6930 Unspecified sequelae of cerebral infarction: Secondary | ICD-10-CM

## 2024-02-27 DIAGNOSIS — I10 Essential (primary) hypertension: Secondary | ICD-10-CM

## 2024-02-27 DIAGNOSIS — I5032 Chronic diastolic (congestive) heart failure: Secondary | ICD-10-CM

## 2024-02-27 DIAGNOSIS — I2510 Atherosclerotic heart disease of native coronary artery without angina pectoris: Secondary | ICD-10-CM

## 2024-02-27 NOTE — Addendum Note (Signed)
 Addended by: ARLOA PLANAS D on: 02/27/2024 10:45 AM   Modules accepted: Orders

## 2024-02-27 NOTE — Patient Instructions (Signed)
 Medication Instructions:  Your physician recommends that you continue on your current medications as directed. Please refer to the Current Medication list given to you today.  *If you need a refill on your cardiac medications before your next appointment, please call your pharmacy*   Lab Work: None Ordered If you have labs (blood work) drawn today and your tests are completely normal, you will receive your results only by: MyChart Message (if you have MyChart) OR A paper copy in the mail If you have any lab test that is abnormal or we need to change your treatment, we will call you to review the results.   Testing/Procedures: Your physician has requested that you have a carotid duplex. This test is an ultrasound of the carotid arteries in your neck. It looks at blood flow through these arteries that supply the brain with blood. Allow one hour for this exam. There are no restrictions or special instructions.    Follow-Up: At [facility], you and your health needs are our priority.  As part of our continuing mission to provide you with exceptional heart care, we have created designated Provider Care Teams.  These Care Teams include your primary Cardiologist (physician) and Advanced Practice Providers (APPs -  Physician Assistants and Nurse Practitioners) who all work together to provide you with the care you need, when you need it.  We recommend signing up for the patient portal called "MyChart".  Sign up information is provided on this After Visit Summary.  MyChart is used to connect with patients for Virtual Visits (Telemedicine).  Patients are able to view lab/test results, encounter notes, upcoming appointments, etc.  Non-urgent messages can be sent to your provider as well.   To learn more about what you can do with MyChart, go to [email or web address].    Your next appointment:   6 month(s)  The format for your next appointment:   In Person  Provider:   Lamar Fitch, MD     Other Instructions NA

## 2024-02-27 NOTE — Progress Notes (Signed)
 Cardiology Office Note:    Date:  02/27/2024   ID:  Wendy Gomez, Pitkin 1950-09-02, MRN [id number]  PCP:  Marelyn Quill, MD  Cardiologist:  Lamar Fitch, MD    Referring MD: Marelyn Quill, MD   Chief Complaint  Patient presents with   Follow-up   Pre-op Exam    History of Present Illness:    Wendy Gomez is a 73 y.o. female past medical history significant for essential hypertension, shortness of breath, hemochromatosis, late effect of CVA, palpitations, dyslipidemia, carotic arterial disease.  She was referred back to us  for evaluation before elective colonoscopy/endoscopy and the question about potentially holding her Eliquis for the procedure.  I reviewed her chart very carefully and apparently she was put on Eliquis after hospitalization for CVA.  She was told to have atrial fibrillation.  After that we put a monitor on her due to echocardiogram and was not able to prove that she does have recurrences of atrial fibrillation.  However anticoagulation has been continued.  There was also question about some strength symptoms she developed in her lower extremities her leg was swelled up with some redness on the skin look like almost a cellulitis but she did see some dermatologist and told her that the sepsis and not cellulitis.  That said to be strange to me.  Also have coronary arterial disease not critical but have not been checked for a long time, dyslipidemia with LDL 166 unable to tolerate medication that she was given by primary care physician.  Comes today because she needs colonoscopy endoscopy likely doing well asymptomatic denies have any chest pain tightness squeezing pressure burning chest, she does what she wants to do.  Past Medical History:  Diagnosis Date   Anxiety    Connective tissue disease (HCC)    COPD (chronic obstructive pulmonary disease) (HCC)    Depression    Diverticulosis    hx of    Endometriosis    GERD (gastroesophageal reflux  disease)    Hemochromatosis    Hemochromatosis    History of Helicobacter pylori infection    Irritable bowel syndrome    Stroke [facility])    Thoracic stomach hernia     Past Surgical History:  Procedure Laterality Date   ABDOMINAL HYSTERECTOMY     APPENDECTOMY     BREAST ENHANCEMENT SURGERY     CHOLECYSTECTOMY     CHOLECYSTECTOMY     COLONOSCOPY  03/19/2014   Mild sigmoid diverticulosis. External and internal hemorrhoids. Otherwise normal colonoscopy to TI.    ESOPHAGOGASTRODUODENOSCOPY  03/29/2014   Small hiatal hernia. Minimal gastritis.    KNEE ARTHROSCOPY Right    LAPAROTOMY      Current Medications: Current Meds  Medication Sig   albuterol (PROVENTIL HFA;VENTOLIN HFA) 108 (90 BASE) MCG/ACT inhaler Inhale 2 puffs into the lungs every 6 (six) hours as needed for wheezing or shortness of breath.   ammonium lactate (LAC-HYDRIN) 12 % lotion Apply 1 Application topically as needed.   cholecalciferol (VITAMIN D3) 25 MCG (1000 UT) tablet Take 2,000 Units by mouth daily.   clotrimazole -betamethasone  (LOTRISONE ) cream Apply 1 Application topically daily. Apply to bottoms of heels daily   diltiazem  (CARDIZEM  CD) 180 MG 24 hr capsule TAKE 1 CAPSULE BY MOUTH EVERY DAY   ELIQUIS 5 MG TABS tablet Take 5 mg by mouth 2 (two) times daily.   fluticasone  (FLONASE) 50 MCG/ACT nasal spray Place 2 sprays into both nostrils as needed for allergies.   furosemide  (LASIX ) 20  MG tablet TAKE 2 TABLETS (40 MG TOTAL) BY MOUTH DAILY.   meclizine  (ANTIVERT ) 25 MG tablet Take 25 mg by mouth 3 (three) times daily as needed.   metoprolol  tartrate (LOPRESSOR ) 25 MG tablet Take 0.5 tablets (12.5 mg total) by mouth 2 (two) times daily.   Omega-3 Fatty Acids (FISH OIL) 1000 MG CAPS Take 2 capsules by mouth daily.   omeprazole (PRILOSEC) 20 MG capsule Take 40 mg by mouth daily.   ondansetron (ZOFRAN) 4 MG tablet Take 8 mg by mouth every 6 (six) hours as needed for nausea or vomiting.   Polyethylene Glycol 3350  (MIRALAX PO) Take 17 g by mouth daily.   potassium chloride  (KLOR-CON ) 10 MEQ tablet TAKE 1 TABLET BY MOUTH EVERY DAY   WIXELA INHUB 100-50 MCG/ACT AEPB INHALE 1 PUFF INTO THE LUNGS TWICE A DAY     Allergies:   Lipitor [atorvastatin] and Morphine and codeine   Social History   Socioeconomic History   Marital status: Divorced    Spouse name: Not on file   Number of children: 3   Years of education: Not on file   Highest education level: Not on file  Occupational History   Occupation: retired  Tobacco Use   Smoking status: Former    Current packs/day: 0.00    Average packs/day: 3.0 packs/day for 26.1 years (78.2 ttl pk-yrs)    Types: Cigarettes    Start date: 34    Quit date: 07/08/2007    Years since quitting: 16.6   Smokeless tobacco: Never  Vaping Use   Vaping status: Never Used  Substance and Sexual Activity   Alcohol use: No   Drug use: No   Sexual activity: Not on file  Other Topics Concern   Not on file  Social History Narrative   Not on file   Social Drivers of Health   Financial Resource Strain: Not on file  Food Insecurity: Not on file  Transportation Needs: Not on file  Physical Activity: Not on file  Stress: Not on file  Social Connections: Not on file     Family History: The patient's family history includes Brain cancer in her father; Diabetes in her sister; Heart disease in her sister and sister; Hemachromatosis in her sister; Hyperlipidemia in her sister; Hypertension in her sister and son; Kidney cancer in her cousin; Lung disease in her sister; Prostate cancer in her father; Thyroid  cancer in her niece and sister. There is no history of Rectal cancer, Stomach cancer, or Esophageal cancer. ROS:   Please see the history of present illness.    All 14 point review of systems negative except as described per history of present illness  EKGs/Labs/Other Studies Reviewed:    EKG Interpretation Date/Time:  Monday February 27 2024 10:11:29  EDT Ventricular Rate:  67 PR Interval:  172 QRS Duration:  88 QT Interval:  394 QTC Calculation: 416 R Axis:   -41  Text Interpretation: Normal sinus rhythm with sinus arrhythmia Left axis deviation Septal infarct , age undetermined Abnormal ECG When compared with ECG of 25-Nov-2023 10:35, No significant change was found Confirmed by Bernie Charleston [phone]) on 02/27/2024 10:17:27 AM    Recent Labs: 11/25/2023: NT-Pro BNP 144 01/20/2024: ALT 11; BUN 14; Creatinine 0.89; Hemoglobin 13.1; Platelet Count 283; Potassium 4.4; Sodium 138  Recent Lipid Panel    Component Value Date/Time   CHOL 235 (H) 06/21/2022 1105   TRIG 62 06/21/2022 1105   HDL 57 06/21/2022 1105   CHOLHDL 4.1 06/21/2022  1105   VLDL 12 06/21/2022 1105   LDLCALC 166 (H) 06/21/2022 1105    Physical Exam:    VS:  BP 130/76   Pulse 67   Ht 5' 5.5 (1.664 m)   Wt 260 lb (117.9 kg)   SpO2 95%   BMI 42.61 kg/m     Wt Readings from Last 3 Encounters:  02/27/24 260 lb (117.9 kg)  12/21/23 260 lb 2 oz (118 kg)  11/25/23 206 lb 12.8 oz (93.8 kg)     GEN:  Well nourished, well developed in no acute distress HEENT: Normal NECK: No JVD; No carotid bruits LYMPHATICS: No lymphadenopathy CARDIAC: RRR, no murmurs, no rubs, no gallops RESPIRATORY:  Clear to auscultation without rales, wheezing or rhonchi  ABDOMEN: Soft, non-tender, non-distended MUSCULOSKELETAL:  No edema; No deformity  SKIN: Warm and dry LOWER EXTREMITIES: no swelling NEUROLOGIC:  Alert and oriented x 3 PSYCHIATRIC:  Normal affect   ASSESSMENT:    1. Essential hypertension   2. Chronic diastolic CHF (congestive heart failure) (HCC)   3. Late effect of cerebrovascular accident (CVA)   4. Former smoker   5. Hemochromatosis, unspecified hemochromatosis type   6. Dyslipidemia    PLAN:    In order of problems listed above:  Essential hypertension blood pressure well-controlled continue present management. Chronic diastolic congestive heart  failure well compensated continue present management. Late effect of CVA, she is anticoagulated for presumption related to atrial fibrillation she was put on the medication while in the hospital.  Will continue for now. Dyslipidemia I will call that rosuvastatin is her primary care physician to get her fasting lipid profile.  I talked to her about the fact that she will need to reduce her cholesterol wise we end up having problems sooner later.  She is very reluctant to start any additional medication but hopefully I be able to convince her she may require PCSK9 agent. Hemochromatosis followed by oncology stable. Cardiovascular evaluation before endoscopy and colonoscopy she will be fine to stop her anticoagulation for 48 hours before procedure and then restart as quickly as feasible from GI point of view   Medication Adjustments/Labs and Tests Ordered: Current medicines are reviewed at length with the patient today.  Concerns regarding medicines are outlined above.  Orders Placed This Encounter  Procedures   EKG 12-Lead   Medication changes: No orders of the defined types were placed in this encounter.   Signed, Lamar DOROTHA Fitch, MD, [facility] 02/27/2024 10:36 AM    [facility]

## 2024-02-27 NOTE — Telephone Encounter (Signed)
 Patient advised that she has been given clearance to hold Eliquis 2 days prior to endoscopy/colonoscopy scheduled for 02-29-24.  Patient advised to take last dose of Eliquis on 02-26-24, and she will be advised when to restart Elquis by Dr Charlanne after the procedure.  Patient agreed to plan and verbalized understanding.  No further questions.

## 2024-02-29 ENCOUNTER — Encounter: Payer: Self-pay | Admitting: Gastroenterology

## 2024-02-29 ENCOUNTER — Ambulatory Visit: Admitting: Gastroenterology

## 2024-02-29 VITALS — BP 137/72 | HR 92 | Temp 96.4°F | Resp 20 | Ht 65.5 in | Wt 260.0 lb

## 2024-02-29 DIAGNOSIS — Z1211 Encounter for screening for malignant neoplasm of colon: Secondary | ICD-10-CM | POA: Diagnosis not present

## 2024-02-29 DIAGNOSIS — K219 Gastro-esophageal reflux disease without esophagitis: Secondary | ICD-10-CM

## 2024-02-29 DIAGNOSIS — K2950 Unspecified chronic gastritis without bleeding: Secondary | ICD-10-CM

## 2024-02-29 DIAGNOSIS — R1310 Dysphagia, unspecified: Secondary | ICD-10-CM | POA: Diagnosis not present

## 2024-02-29 DIAGNOSIS — Z83719 Family history of colon polyps, unspecified: Secondary | ICD-10-CM

## 2024-02-29 DIAGNOSIS — D124 Benign neoplasm of descending colon: Secondary | ICD-10-CM

## 2024-02-29 DIAGNOSIS — K635 Polyp of colon: Secondary | ICD-10-CM

## 2024-02-29 DIAGNOSIS — K5730 Diverticulosis of large intestine without perforation or abscess without bleeding: Secondary | ICD-10-CM | POA: Diagnosis not present

## 2024-02-29 DIAGNOSIS — K640 First degree hemorrhoids: Secondary | ICD-10-CM

## 2024-02-29 DIAGNOSIS — Q399 Congenital malformation of esophagus, unspecified: Secondary | ICD-10-CM | POA: Diagnosis not present

## 2024-02-29 DIAGNOSIS — R1031 Right lower quadrant pain: Secondary | ICD-10-CM

## 2024-02-29 DIAGNOSIS — K449 Diaphragmatic hernia without obstruction or gangrene: Secondary | ICD-10-CM

## 2024-02-29 MED ORDER — SODIUM CHLORIDE 0.9 % IV SOLN: 500.0000 mL | Freq: Once | INTRAVENOUS | Status: DC

## 2024-02-29 NOTE — Op Note (Signed)
 [facility] Patient Name: Wendy Gomez Procedure Date: 02/29/2024 7:24 AM MRN: [id number] Endoscopist: Lynnie Bring , MD, [id number] Age: 73 Referring MD:  Date of Birth: 11/16/1950 Gender: Female Account #: [id number] Procedure:                Colonoscopy Indications:              High risk colon cancer surveillance: Personal                            history of colonic polyps. FH polyps in mother.                            Previously thought her dad had colon cancer but it                            turns out that he had prostate cancer. Medicines:                Monitored Anesthesia Care Procedure:                Pre-Anesthesia Assessment:                           - Prior to the procedure, a History and Physical                            was performed, and patient medications and                            allergies were reviewed. The patient's tolerance of                            previous anesthesia was also reviewed. The risks                            and benefits of the procedure and the sedation                            options and risks were discussed with the patient.                            All questions were answered, and informed consent                            was obtained. Prior Anticoagulants: The patient has                            taken Eliquis (apixaban), last dose was 2 days                            prior to procedure. ASA Grade Assessment: III - A                            patient with severe systemic disease. After  reviewing the risks and benefits, the patient was                            deemed in satisfactory condition to undergo the                            procedure.                           After obtaining informed consent, the colonoscope                            was passed under direct vision. Throughout the                            procedure, the patient's blood pressure, pulse, and                             oxygen saturations were monitored continuously. The                            Olympus Scope SN: [id number] was introduced through                            the anus and advanced to the the cecum, identified                            by appendiceal orifice and ileocecal valve. The                            colonoscopy was performed without difficulty. The                            patient tolerated the procedure well. The quality                            of the bowel preparation was good. The ileocecal                            valve, appendiceal orifice, and rectum were                            photographed. Scope In: 8:27:43 AM Scope Out: 8:41:19 AM Scope Withdrawal Time: 0 hours 8 minutes 45 seconds  Total Procedure Duration: 0 hours 13 minutes 36 seconds  Findings:                 A 4 mm polyp was found in the proximal descending                            colon. The polyp was sessile. The polyp was removed                            with a cold snare. Resection and retrieval were  complete.                           A few medium-mouthed diverticula were found in the                            sigmoid colon.                           Non-bleeding internal hemorrhoids were found during                            retroflexion. The hemorrhoids were small and Grade                            I (internal hemorrhoids that do not prolapse).                           The exam was otherwise without abnormality on                            direct and retroflexion views. Complications:            No immediate complications. Estimated Blood Loss:     Estimated blood loss: none. Impression:               - One 4 mm polyp in the proximal descending colon,                            removed with a cold snare. Resected and retrieved.                           - Moderate sigmoid diverticulosis.                           - Non-bleeding internal  hemorrhoids.                           - The examination was otherwise normal on direct                            and retroflexion views. Recommendation:           - Patient has a contact number available for                            emergencies. The signs and symptoms of potential                            delayed complications were discussed with the                            patient. Return to normal activities tomorrow.                            Written discharge instructions were provided to the  patient.                           - Resume previous high-fiber diet.                           - Continue present medications.                           - Await pathology results.                           - Repeat colonoscopy is not recommended for                            surveillance. Hence repeat colonoscopy only if with                            any new problems.                           - Resume Eliquis (apixaban) at prior dose tomorrow.                           - The findings and recommendations were discussed                            with the patient's family. Lynnie Bring, MD 02/29/2024 8:50:03 AM This report has been signed electronically.

## 2024-02-29 NOTE — Patient Instructions (Addendum)
 Continue present medications. Await pathology results. Repeat colonoscopy is not recommended for surveillance. Hence repeat colonoscopy only if with any new problems. Resume Eliquis (apixaban) at prior dose tomorrow 03/01/24. Postdilatation diet.   YOU HAD AN ENDOSCOPIC PROCEDURE TODAY AT THE [facility]:   Refer to the procedure report that was given to you for any specific questions about what was found during the examination.  If the procedure report does not answer your questions, please call your gastroenterologist to clarify.  If you requested that your care partner not be given the details of your procedure findings, then the procedure report has been included in a sealed envelope for you to review at your convenience later.  YOU SHOULD EXPECT: Some feelings of bloating in the abdomen. Passage of more gas than usual.  Walking can help get rid of the air that was put into your GI tract during the procedure and reduce the bloating. If you had a lower endoscopy (such as a colonoscopy or flexible sigmoidoscopy) you may notice spotting of blood in your stool or on the toilet paper. If you underwent a bowel prep for your procedure, you may not have a normal bowel movement for a few days.  Please Note:  You might notice some irritation and congestion in your nose or some drainage.  This is from the oxygen used during your procedure.  There is no need for concern and it should clear up in a day or so.  SYMPTOMS TO REPORT IMMEDIATELY:  Following lower endoscopy (colonoscopy or flexible sigmoidoscopy):  Excessive amounts of blood in the stool  Significant tenderness or worsening of abdominal pains  Swelling of the abdomen that is new, acute  Fever of 100F or higher  Following upper endoscopy (EGD)  Vomiting of blood or coffee ground material  New chest pain or pain under the shoulder blades  Painful or persistently difficult swallowing  New shortness of breath  Fever of 100F or  higher  Kocsis, tarry-looking stools  For urgent or emergent issues, a gastroenterologist can be reached at any hour by calling [phone]. Do not use MyChart messaging for urgent concerns.    DIET:  Follow dilation diet.  Drink plenty of fluids but you should avoid alcoholic beverages for 24 hours.  ACTIVITY:  You should plan to take it easy for the rest of today and you should NOT DRIVE or use heavy machinery until tomorrow (because of the sedation medicines used during the test).    FOLLOW UP: Our staff will call the number listed on your records the next business day following your procedure.  We will call around 7:15- 8:00 am to check on you and address any questions or concerns that you may have regarding the information given to you following your procedure. If we do not reach you, we will leave a message.     If any biopsies were taken you will be contacted by phone or by letter within the next 1-3 weeks.  Please call us  at [phone] if you have not heard about the biopsies in 3 weeks.    SIGNATURES/CONFIDENTIALITY: You and/or your care partner have signed paperwork which will be entered into your electronic medical record.  These signatures attest to the fact that that the information above on your After Visit Summary has been reviewed and is understood.  Full responsibility of the confidentiality of this discharge information lies with you and/or your care-partner.

## 2024-02-29 NOTE — Op Note (Signed)
  [facility] Patient Name: Wendy Gomez Procedure Date: 02/29/2024 7:25 AM MRN: [id number] Endoscopist: Lynnie Bring , MD, [id number] Age: 73 Referring MD:  Date of Birth: 05/21/1951 Gender: Female Account #: [id number] Procedure:                Upper GI endoscopy Indications:              Epigastric abdominal pain, Dysphagia Medicines:                Monitored Anesthesia Care Procedure:                Pre-Anesthesia Assessment:                           - Prior to the procedure, a History and Physical                            was performed, and patient medications and                            allergies were reviewed. The patient's tolerance of                            previous anesthesia was also reviewed. The risks                            and benefits of the procedure and the sedation                            options and risks were discussed with the patient.                            All questions were answered, and informed consent                            was obtained. Prior Anticoagulants: The patient has                            taken Eliquis (apixaban), last dose was 2 days                            prior to procedure. ASA Grade Assessment: III - A                            patient with severe systemic disease. After                            reviewing the risks and benefits, the patient was                            deemed in satisfactory condition to undergo the                            procedure.  After obtaining informed consent, the endoscope was                            passed under direct vision. Throughout the                            procedure, the patient's blood pressure, pulse, and                            oxygen saturations were monitored continuously. The                            Endoscope was introduced through the mouth, and                            advanced to the second part of duodenum. The  upper                            GI endoscopy was accomplished without difficulty.                            The patient tolerated the procedure well. Scope In: Scope Out: Findings:                 The examined esophagus was mildly tortuous.                            Biopsies were obtained from the proximal and distal                            esophagus with cold forceps for histology of                            suspected eosinophilic esophagitis.                           The Z-line was regular and was found 36 cm from the                            incisors.                           A small hiatal hernia was present.                           The gastroesophageal flap valve was visualized                            endoscopically and classified as Hill Grade III                            (minimal fold, loose to endoscope, hiatal hernia                            likely).  Localized mildly erythematous mucosa without                            bleeding was found in the gastric antrum. Biopsies                            were taken with a cold forceps for histology.                           The examined duodenum was normal.                           No endoscopic abnormality was evident in the                            esophagus to explain the patient's complaint of                            dysphagia. It was decided, however, to proceed with                            dilation of the entire esophagus. The scope was                            withdrawn. Dilation was performed with a Maloney                            dilator with mild resistance at 50 Fr. Complications:            No immediate complications. Estimated Blood Loss:     Estimated blood loss: none. Impression:               - Presbyesophagus s/p eso dilatation                           - Small hiatal hernia.                           - Mild gastritis                           - Otherwise  normal EGD. Recommendation:           - Patient has a contact number available for                            emergencies. The signs and symptoms of potential                            delayed complications were discussed with the                            patient. Return to normal activities tomorrow.                            Written discharge instructions were provided to the  patient.                           - Postdilatation diet.                           - Continue present medications including omeprazole                           - Await pathology results.                           - The findings and recommendations were discussed                            with the patient's family. Lynnie Bring, MD 02/29/2024 8:45:57 AM This report has been signed electronically.

## 2024-02-29 NOTE — Progress Notes (Signed)
 Chief Complaint: Abdo pain  Referring Provider:  Marelyn Quill, MD      ASSESSMENT AND PLAN;   #1. RLQ Pain.  #2. Chronic constipation. #3. GERD with small HH. Now with occ dysphagia #4. FH CRC (dad at age 72), FH of colonic polyps (mom at age 72). #5. 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  Being followed by Dr. Ezzard.  Stable.  Plan: - Check CBC, CMP, AFP - CT abdo/pel with p.o. and IV contrast  - EGD with dil/colon with miralax after holding Eliquis x 2 days and cardiology clearance - Continue miralax 17g po qd. - Continue omeprazole - FU thereafter.  HPI:    Wendy Gomez is a 73 y.o. 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  She experiences severe right lower abdominal pain radiating to her back, particularly when she needs to urinate. The pain is described as severe, requiring her to 'make herself get up' to go to the bathroom, where she leaks urine on the way. Sitting down relieves some of the pain, which lasts about a minute. She relates this pain to previous experiences following gallbladder surgery, where she had complications including a leak.  She has a history of constipation, which is managed with Miralax. She experiences constipation if she does not take Miralax regularly. No diarrhea is reported.  She takes omeprazole for reflux and has a history of a hiatal hernia. Occasionally, she experiences difficulty swallowing, even with water, which she attributes to weight gain.  She has hemochromatosis and undergoes phlebotomy every six months. Her last blood draw was over a year  ago, but her ferritin and iron saturation levels have been stable.  She had a stroke nearly three years ago and is on Eliquis for stroke prevention. She does not have a neurologist but her Eliquis is managed by Doctor Cystasis.  She reports that she has congestive heart failure, specifically diastolic failure, and she experiences leg swelling. She has had an echocardiogram recently, which showed an ejection fraction of 55-60%.  She has a history of multiple surgeries for scar tissue removal, which she relates to her current abdominal pain. She also experiences back pain, which she attributes to arthritis.  There is no melena or hematochezia. No unintentional weight loss.  Convinced that her pain is due to pelvic adhesions.  No nonsteroidals.   Wt Readings from Last 3 Encounters:  02/29/24 260 lb (117.9 kg)  02/27/24 260 lb (117.9 kg)  12/21/23 260 lb 2 oz (118 kg)     Used to work in [employer] retired.  Additional past surgical history: -H/O endometriosis, total hysterectomy -Lap chole (Dr Bert) complicated by postop leak requiring ERCP (Dr Charlanne)  Past GI procedures: -EGD 03/29/2014: Small hiatal hernia, mild gastritis. CLO- neg -Colonoscopy 03/29/2014 (adult, some difficulty): Mild sigmoid diverticulosis, small internal hemorrhoids  Colon: 12/2018 - Colonic polyps status post polypectomy. - Mild sigmoid diverticulosis. - Otherwise normal colonoscopy to TI. - Bx; hyperplastic - Rpt 5 yrs d/t FH  -CT AP 2020 IMPRESSION: 1. No acute findings within the abdomen or pelvis. 2. Small paraumbilical ventral hernia containing only fat. Aortic Atherosclerosis (ICD10-I70.0).  2DE 12/14/2023: EF 55 to  60%. Past Medical History:  Diagnosis Date   Anxiety    Connective tissue disease (HCC)    COPD (chronic obstructive pulmonary disease) (HCC)    Depression    Diverticulosis    hx of    Endometriosis    GERD (gastroesophageal reflux disease)    Hemochromatosis     Hemochromatosis    History of Helicobacter pylori infection    Irritable bowel syndrome    Stroke [facility])    Thoracic stomach hernia     Past Surgical History:  Procedure Laterality Date   ABDOMINAL HYSTERECTOMY     APPENDECTOMY     BREAST ENHANCEMENT SURGERY     CHOLECYSTECTOMY     CHOLECYSTECTOMY     COLONOSCOPY  03/19/2014   Mild sigmoid diverticulosis. External and internal hemorrhoids. Otherwise normal colonoscopy to TI.    ESOPHAGOGASTRODUODENOSCOPY  03/29/2014   Small hiatal hernia. Minimal gastritis.    KNEE ARTHROSCOPY Right    LAPAROTOMY      Family History  Problem Relation Age of Onset   Prostate cancer Father    Brain cancer Father    Hypertension Sister    Diabetes Sister    Heart disease Sister    Heart disease Sister    Thyroid  cancer Sister    Hyperlipidemia Sister    Hemachromatosis Sister    Lung disease Sister    Hypertension Son    Kidney cancer Cousin    Thyroid  cancer Niece    Rectal cancer Neg Hx    Stomach cancer Neg Hx    Esophageal cancer Neg Hx     Social History   Tobacco Use   Smoking status: Former    Current packs/day: 0.00    Average packs/day: 3.0 packs/day for 26.1 years (78.2 ttl pk-yrs)    Types: Cigarettes    Start date: 84    Quit date: 07/08/2007    Years since quitting: 16.6   Smokeless tobacco: Never  Vaping Use   Vaping status: Never Used  Substance Use Topics   Alcohol use: No   Drug use: No    Current Outpatient Medications  Medication Sig Dispense Refill   clotrimazole -betamethasone  (LOTRISONE ) cream Apply 1 Application topically daily. Apply to bottoms of heels daily 45 g 3   diltiazem  (CARDIZEM  CD) 180 MG 24 hr capsule TAKE 1 CAPSULE BY MOUTH EVERY DAY 90 capsule 3   furosemide  (LASIX ) 20 MG tablet TAKE 2 TABLETS (40 MG TOTAL) BY MOUTH DAILY. 60 tablet 1   meclizine  (ANTIVERT ) 25 MG tablet Take 25 mg by mouth 3 (three) times daily as needed.     metoprolol  tartrate (LOPRESSOR ) 25 MG tablet Take 0.5  tablets (12.5 mg total) by mouth 2 (two) times daily. 60 tablet 1   Omega-3 Fatty Acids (FISH OIL) 1000 MG CAPS Take 2 capsules by mouth daily.     omeprazole (PRILOSEC) 20 MG capsule Take 40 mg by mouth daily.     potassium chloride  (KLOR-CON ) 10 MEQ tablet TAKE 1 TABLET BY MOUTH EVERY DAY 90 tablet 3   WIXELA INHUB 100-50 MCG/ACT AEPB INHALE 1 PUFF INTO THE LUNGS TWICE A DAY 60 each 5   albuterol (PROVENTIL HFA;VENTOLIN HFA) 108 (90 BASE) MCG/ACT inhaler Inhale 2 puffs into the lungs every 6 (six) hours as needed for wheezing or shortness of breath.     ammonium lactate (LAC-HYDRIN) 12 % lotion Apply 1 Application topically as needed.     cholecalciferol (VITAMIN D3) 25 MCG (1000 UT) tablet Take 2,000 Units  by mouth daily. (Patient not taking: Reported on 02/29/2024)     ELIQUIS 5 MG TABS tablet Take 5 mg by mouth 2 (two) times daily.     fluticasone  (FLONASE) 50 MCG/ACT nasal spray Place 2 sprays into both nostrils as needed for allergies.     ondansetron (ZOFRAN) 4 MG tablet Take 8 mg by mouth every 6 (six) hours as needed for nausea or vomiting.     Polyethylene Glycol 3350 (MIRALAX PO) Take 17 g by mouth daily.     Current Facility-Administered Medications  Medication Dose Route Frequency Provider Last Rate Last Admin   0.9 %  sodium chloride  infusion  500 mL Intravenous Once Charlanne Groom, MD        Allergies  Allergen Reactions   Lipitor [Atorvastatin] Other (See Comments)   Morphine And Codeine Nausea And Vomiting    Review of Systems:  Constitutional: Denies fever, chills, diaphoresis, appetite change and has fatigue.  HEENT: Denies photophobia, eye pain, redness, hearing loss, ear pain, congestion, sore throat, rhinorrhea, sneezing, mouth sores, neck pain, neck stiffness and tinnitus.   Respiratory: Denies SOB, DOE, cough, chest tightness,  and wheezing.   Cardiovascular: Denies chest pain, palpitations and leg swelling.  Genitourinary: Denies dysuria, urgency, frequency,  hematuria, flank pain and difficulty urinating.  Musculoskeletal: Has myalgias, back pain, joint swelling, arthralgias and gait problem.  Skin: No rash.  Neurological: Denies dizziness, seizures, syncope, weakness, light-headedness, numbness and headaches.  Hematological: Denies adenopathy. Easy bruising, personal or family bleeding history  Psychiatric/Behavioral: Has anxiety or depression     Physical Exam:    BP 137/85   Pulse 85   Temp (!) 96.4 F (35.8 C)   Resp 13   Ht 5' 5.5 (1.664 m)   Wt 260 lb (117.9 kg)   SpO2 99%   BMI 42.61 kg/m  Filed Weights   02/29/24 0716  Weight: 260 lb (117.9 kg)   Gen: awake, alert, NAD HEENT: anicteric, no pallor CV: RRR, no mrg Pulm: CTA b/l Abd: soft, NT/ND, +BS throughout Ext: no c/c/e Neuro: nonfocal    Anselm Charlanne, MD 02/29/2024, 8:16 AM  Cc: Marelyn Quill, MD

## 2024-02-29 NOTE — Progress Notes (Signed)
 Sedate, gd SR, tolerated procedure well, VSS, report to RN

## 2024-02-29 NOTE — Progress Notes (Signed)
 Called to room to assist during endoscopic procedure.  Patient ID and intended procedure confirmed with present staff. Received instructions for my participation in the procedure from the performing physician.

## 2024-03-01 ENCOUNTER — Telehealth: Payer: Self-pay | Admitting: *Deleted

## 2024-03-01 NOTE — Telephone Encounter (Signed)
  Follow up Call-     02/29/2024    7:20 AM  Call back number  Post procedure Call Back phone  # [phone]  Permission to leave phone message Yes     Patient questions:  Do you have a fever, pain , or abdominal swelling? No. Pain Score  0 *  Have you tolerated food without any problems? Yes.    Have you been able to return to your normal activities? Yes.    Do you have any questions about your discharge instructions: Diet   No. Medications  No. Follow up visit  No.  Do you have questions or concerns about your Care? No.  Actions: * If pain score is 4 or above: No action needed, pain <4.

## 2024-03-02 LAB — SURGICAL PATHOLOGY

## 2024-03-04 ENCOUNTER — Ambulatory Visit: Payer: Self-pay | Admitting: Gastroenterology

## 2024-03-08 ENCOUNTER — Encounter: Payer: Self-pay | Admitting: Family Medicine

## 2024-03-21 ENCOUNTER — Ambulatory Visit: Attending: Cardiology

## 2024-03-21 DIAGNOSIS — I6930 Unspecified sequelae of cerebral infarction: Secondary | ICD-10-CM | POA: Diagnosis not present

## 2024-03-21 DIAGNOSIS — I2510 Atherosclerotic heart disease of native coronary artery without angina pectoris: Secondary | ICD-10-CM

## 2024-03-22 ENCOUNTER — Telehealth: Payer: Self-pay | Admitting: Gastroenterology

## 2024-03-22 NOTE — Telephone Encounter (Signed)
 Returned call to patient & discussed path letter sent to her. She felt uneasy that a colon was not recommended after 81 to her. Advised her if she still wanted to pursue a colonoscopy in 10 years then to call our office & we will schedule an office visit to discuss with Dr. Charlanne. We also discussed her upcoming lab work. Pt had no further questions.

## 2024-03-22 NOTE — Telephone Encounter (Signed)
See 10/9 TE.

## 2024-03-22 NOTE — Telephone Encounter (Signed)
 Inbound call from patient stating that she received a letter in the mail regarding her results from her procedures. She states the letter says the polyps that were removed were precancerous. She states she is uneasy about it and is requesting a call to discuss what her next steps. Please advise.

## 2024-03-27 ENCOUNTER — Other Ambulatory Visit (INDEPENDENT_AMBULATORY_CARE_PROVIDER_SITE_OTHER)

## 2024-03-27 DIAGNOSIS — R1031 Right lower quadrant pain: Secondary | ICD-10-CM | POA: Diagnosis not present

## 2024-03-27 DIAGNOSIS — K219 Gastro-esophageal reflux disease without esophagitis: Secondary | ICD-10-CM

## 2024-03-27 DIAGNOSIS — K5909 Other constipation: Secondary | ICD-10-CM

## 2024-03-27 DIAGNOSIS — Z83719 Family history of colon polyps, unspecified: Secondary | ICD-10-CM

## 2024-03-27 DIAGNOSIS — Z800 Family history of malignant neoplasm of digestive organs: Secondary | ICD-10-CM

## 2024-03-27 DIAGNOSIS — R1310 Dysphagia, unspecified: Secondary | ICD-10-CM

## 2024-03-28 ENCOUNTER — Other Ambulatory Visit: Payer: Self-pay | Admitting: Cardiology

## 2024-03-28 ENCOUNTER — Ambulatory Visit: Payer: Self-pay | Admitting: Cardiology

## 2024-03-29 ENCOUNTER — Telehealth: Payer: Self-pay

## 2024-03-29 LAB — AFP TUMOR MARKER: AFP-Tumor Marker: 6.7 ng/mL — ABNORMAL HIGH

## 2024-03-29 NOTE — Telephone Encounter (Signed)
 Pt viewed [place] results on My Chart per Dr. Vanetta Shawl note. Routed to PCP.

## 2024-04-03 ENCOUNTER — Encounter: Payer: Self-pay | Admitting: Gastroenterology

## 2024-04-04 ENCOUNTER — Ambulatory Visit: Payer: Self-pay | Admitting: Gastroenterology

## 2024-04-04 DIAGNOSIS — Z83719 Family history of colon polyps, unspecified: Secondary | ICD-10-CM

## 2024-04-04 DIAGNOSIS — R1031 Right lower quadrant pain: Secondary | ICD-10-CM

## 2024-04-04 DIAGNOSIS — K449 Diaphragmatic hernia without obstruction or gangrene: Secondary | ICD-10-CM

## 2024-04-04 DIAGNOSIS — Z800 Family history of malignant neoplasm of digestive organs: Secondary | ICD-10-CM

## 2024-04-04 DIAGNOSIS — K5909 Other constipation: Secondary | ICD-10-CM

## 2024-05-28 ENCOUNTER — Other Ambulatory Visit: Payer: Self-pay | Admitting: Nurse Practitioner

## 2024-05-28 DIAGNOSIS — J4540 Moderate persistent asthma, uncomplicated: Secondary | ICD-10-CM

## 2024-06-08 ENCOUNTER — Telehealth: Payer: Self-pay | Admitting: Gastroenterology

## 2024-06-08 NOTE — Telephone Encounter (Signed)
 Inbound call from patient stating she would like to get her repeated lab for tumor marker since she was advised to repeat in January. Patient would like to be advised when lab work is put in.  Please advise  Thank you

## 2024-06-08 NOTE — Telephone Encounter (Signed)
 Patient is due for labs around January 14th (3 month follow up labs). She would like to come in on 1/9, advised her that would be okay. No appointment required for labs. Pt had no further questions.

## 2024-06-24 ENCOUNTER — Encounter: Payer: Self-pay | Admitting: Oncology

## 2024-07-10 ENCOUNTER — Telehealth: Payer: Self-pay

## 2024-07-10 NOTE — Telephone Encounter (Signed)
-----   Message from Nurse Cristino NOVAK, RN sent at 04/05/2024 11:50 AM EDT ----- Regarding: 42-month lab reminder AFP due/ Gupta/order in epic

## 2024-07-10 NOTE — Telephone Encounter (Signed)
 Reminder was received in Epic. Pt was made aware.  Location to lab and hours of operation was provided to pt.  Pt verbalized understanding with all questions answered.

## 2024-07-17 ENCOUNTER — Encounter: Payer: Self-pay | Admitting: Oncology

## 2024-07-20 ENCOUNTER — Other Ambulatory Visit: Payer: Self-pay

## 2024-07-20 DIAGNOSIS — I10 Essential (primary) hypertension: Secondary | ICD-10-CM

## 2024-07-20 DIAGNOSIS — E038 Other specified hypothyroidism: Secondary | ICD-10-CM

## 2024-07-20 DIAGNOSIS — R7303 Prediabetes: Secondary | ICD-10-CM

## 2024-07-24 ENCOUNTER — Other Ambulatory Visit

## 2024-07-24 ENCOUNTER — Inpatient Hospital Stay

## 2024-07-25 ENCOUNTER — Inpatient Hospital Stay: Admitting: Oncology

## 2024-09-24 ENCOUNTER — Ambulatory Visit: Admitting: Cardiology
